# Patient Record
Sex: Male | Born: 1975
Health system: Southern US, Community
[De-identification: ages and names within clinical notes are randomized; demographics above are authoritative.]

## PROBLEM LIST (undated history)

## (undated) DIAGNOSIS — K5792 Diverticulitis of intestine, part unspecified, without perforation or abscess without bleeding: Secondary | ICD-10-CM

## (undated) DIAGNOSIS — M719 Bursopathy, unspecified: Secondary | ICD-10-CM

## (undated) DIAGNOSIS — J939 Pneumothorax, unspecified: Secondary | ICD-10-CM

## (undated) DIAGNOSIS — M5430 Sciatica, unspecified side: Secondary | ICD-10-CM

## (undated) HISTORY — DX: Diverticulitis of intestine, part unspecified, without perforation or abscess without bleeding: K57.92

## (undated) HISTORY — PX: CHEST TUBE INSERTION: SHX231

## (undated) HISTORY — PX: HERNIA REPAIR: SHX51

## (undated) HISTORY — DX: Sciatica, unspecified side: M54.30

---

## 2004-02-02 ENCOUNTER — Emergency Department: Payer: Self-pay | Admitting: Internal Medicine

## 2005-02-27 ENCOUNTER — Emergency Department: Payer: Self-pay | Admitting: Internal Medicine

## 2005-04-22 ENCOUNTER — Emergency Department: Payer: Self-pay | Admitting: Emergency Medicine

## 2006-06-04 ENCOUNTER — Emergency Department: Payer: Self-pay | Admitting: Emergency Medicine

## 2007-03-24 ENCOUNTER — Emergency Department: Payer: Self-pay | Admitting: Emergency Medicine

## 2007-07-13 ENCOUNTER — Emergency Department: Payer: Self-pay | Admitting: Emergency Medicine

## 2008-02-09 ENCOUNTER — Emergency Department: Payer: Self-pay | Admitting: Emergency Medicine

## 2008-07-04 ENCOUNTER — Emergency Department: Payer: Self-pay | Admitting: Emergency Medicine

## 2009-03-16 ENCOUNTER — Emergency Department: Payer: Self-pay | Admitting: Emergency Medicine

## 2009-11-28 ENCOUNTER — Emergency Department: Payer: Self-pay | Admitting: Emergency Medicine

## 2011-04-25 ENCOUNTER — Emergency Department: Payer: Self-pay | Admitting: Emergency Medicine

## 2011-07-16 ENCOUNTER — Emergency Department: Payer: Self-pay | Admitting: Internal Medicine

## 2011-07-16 LAB — CK TOTAL AND CKMB (NOT AT ARMC)
CK, Total: 209 U/L (ref 35–232)
CK-MB: 2.1 ng/mL (ref 0.5–3.6)

## 2011-07-16 LAB — CBC
HCT: 40.7 % (ref 40.0–52.0)
HGB: 13.2 g/dL (ref 13.0–18.0)
MCHC: 32.5 g/dL (ref 32.0–36.0)
MCV: 96 fL (ref 80–100)
RBC: 4.24 10*6/uL — ABNORMAL LOW (ref 4.40–5.90)
RDW: 12.9 % (ref 11.5–14.5)

## 2011-07-16 LAB — BASIC METABOLIC PANEL
Anion Gap: 9 (ref 7–16)
BUN: 12 mg/dL (ref 7–18)
Chloride: 107 mmol/L (ref 98–107)
Co2: 24 mmol/L (ref 21–32)
Glucose: 87 mg/dL (ref 65–99)
Osmolality: 279 (ref 275–301)

## 2011-07-16 LAB — TROPONIN I: Troponin-I: <0.02 ng/mL

## 2012-02-07 ENCOUNTER — Emergency Department: Payer: Self-pay | Admitting: Emergency Medicine

## 2012-04-13 ENCOUNTER — Emergency Department: Payer: Self-pay | Admitting: Emergency Medicine

## 2012-04-13 LAB — COMPREHENSIVE METABOLIC PANEL
Alkaline Phosphatase: 71 U/L (ref 50–136)
BUN: 11 mg/dL (ref 7–18)
Bilirubin,Total: 0.8 mg/dL (ref 0.2–1.0)
Calcium, Total: 8.5 mg/dL (ref 8.5–10.1)
Chloride: 108 mmol/L — ABNORMAL HIGH (ref 98–107)
Co2: 26 mmol/L (ref 21–32)
Creatinine: 0.69 mg/dL (ref 0.60–1.30)
EGFR (African American): >60
EGFR (Non-African Amer.): >60
Glucose: 90 mg/dL (ref 65–99)
SGPT (ALT): 23 U/L (ref 12–78)
Sodium: 139 mmol/L (ref 136–145)
Total Protein: 6.8 g/dL (ref 6.4–8.2)

## 2012-04-13 LAB — URINALYSIS, COMPLETE
Bacteria: NONE SEEN
Bilirubin,UR: NEGATIVE
Blood: NEGATIVE
RBC,UR: 1 /HPF (ref 0–5)
Specific Gravity: 1.018 (ref 1.003–1.030)
Squamous Epithelial: NONE SEEN
WBC UR: 1 /HPF (ref 0–5)

## 2012-04-13 LAB — CBC
HGB: 13.2 g/dL (ref 13.0–18.0)
Platelet: 259 10*3/uL (ref 150–440)
RBC: 4.11 10*6/uL — ABNORMAL LOW (ref 4.40–5.90)
WBC: 5.3 10*3/uL (ref 3.8–10.6)

## 2012-04-13 LAB — LIPASE, BLOOD: Lipase: 61 U/L — ABNORMAL LOW (ref 73–393)

## 2012-10-01 ENCOUNTER — Emergency Department: Payer: Self-pay | Admitting: Emergency Medicine

## 2012-12-29 ENCOUNTER — Emergency Department: Payer: Self-pay | Admitting: Emergency Medicine

## 2013-02-15 ENCOUNTER — Emergency Department: Payer: Self-pay | Admitting: Emergency Medicine

## 2013-02-15 LAB — RAPID INFLUENZA A&B ANTIGENS (ARMC ONLY)

## 2013-02-17 ENCOUNTER — Emergency Department: Payer: Self-pay | Admitting: Emergency Medicine

## 2013-05-27 ENCOUNTER — Emergency Department: Payer: Self-pay | Admitting: Emergency Medicine

## 2013-10-13 DIAGNOSIS — M7712 Lateral epicondylitis, left elbow: Secondary | ICD-10-CM | POA: Insufficient documentation

## 2013-11-23 ENCOUNTER — Emergency Department: Payer: Self-pay | Admitting: Student

## 2014-03-21 ENCOUNTER — Emergency Department: Payer: Self-pay | Admitting: Emergency Medicine

## 2014-05-18 ENCOUNTER — Emergency Department
Admission: EM | Admit: 2014-05-18 | Discharge: 2014-05-18 | Disposition: A | Discharge status: Home / Self Care | Payer: No Typology Code available for payment source | Attending: Emergency Medicine | Admitting: Emergency Medicine

## 2014-05-18 ENCOUNTER — Encounter: Payer: Self-pay | Admitting: *Deleted

## 2014-05-18 DIAGNOSIS — M549 Dorsalgia, unspecified: Secondary | ICD-10-CM | POA: Diagnosis not present

## 2014-05-18 DIAGNOSIS — M5430 Sciatica, unspecified side: Secondary | ICD-10-CM

## 2014-05-18 DIAGNOSIS — Z720 Tobacco use: Secondary | ICD-10-CM | POA: Diagnosis not present

## 2014-05-18 DIAGNOSIS — M5432 Sciatica, left side: Secondary | ICD-10-CM | POA: Diagnosis not present

## 2014-05-18 DIAGNOSIS — G8929 Other chronic pain: Secondary | ICD-10-CM | POA: Insufficient documentation

## 2014-05-18 LAB — URINALYSIS COMPLETE WITH MICROSCOPIC (ARMC ONLY)
BACTERIA UA: NONE SEEN
BILIRUBIN URINE: NEGATIVE
GLUCOSE, UA: NEGATIVE mg/dL
Hgb urine dipstick: NEGATIVE
Ketones, ur: NEGATIVE mg/dL
LEUKOCYTES UA: NEGATIVE
Nitrite: NEGATIVE
Protein, ur: NEGATIVE mg/dL
SPECIFIC GRAVITY, URINE: 1.02 (ref 1.005–1.030)
Squamous Epithelial / LPF: NONE SEEN
pH: 7 (ref 5.0–8.0)

## 2014-05-18 MED ORDER — KETOROLAC TROMETHAMINE 60 MG/2ML IM SOLN
60.0000 mg | Freq: Once | INTRAMUSCULAR | Status: AC
  Administered 2014-05-18: 60 mg via INTRAMUSCULAR

## 2014-05-18 MED ORDER — KETOROLAC TROMETHAMINE 60 MG/2ML IM SOLN
INTRAMUSCULAR | Status: AC
  Administered 2014-05-18: 60 mg
  Filled 2014-05-18: qty 2

## 2014-05-18 MED ORDER — CYCLOBENZAPRINE HCL 5 MG PO TABS: 5.0000 mg | ORAL_TABLET | Freq: Three times a day (TID) | ORAL | Status: DC | PRN

## 2014-05-18 MED ORDER — KETOROLAC TROMETHAMINE 60 MG/2ML IM SOLN
INTRAMUSCULAR | Status: AC
  Filled 2014-05-18: qty 2

## 2014-05-18 MED ORDER — KETOROLAC TROMETHAMINE 10 MG PO TABS: 10.0000 mg | ORAL_TABLET | Freq: Three times a day (TID) | ORAL | Status: DC

## 2014-05-18 NOTE — Discharge Instructions (Signed)
Chronic Back Pain  When back pain lasts longer than 3 months, it is called chronic back pain.People with chronic back pain often go through certain periods that are more intense (flare-ups).  CAUSES Chronic back pain can be caused by wear and tear (degeneration) on different structures in your back. These structures include:  The bones of your spine (vertebrae) and the joints surrounding your spinal cord and nerve roots (facets).  The strong, fibrous tissues that connect your vertebrae (ligaments). Degeneration of these structures may result in pressure on your nerves. This can lead to constant pain. HOME CARE INSTRUCTIONS  Avoid bending, heavy lifting, prolonged sitting, and activities which make the problem worse.  Take brief periods of rest throughout the day to reduce your pain. Lying down or standing usually is better than sitting while you are resting.  Take over-the-counter or prescription medicines only as directed by your caregiver. SEEK IMMEDIATE MEDICAL CARE IF:   You have weakness or numbness in one of your legs or feet.  You have trouble controlling your bladder or bowels.  You have nausea, vomiting, abdominal pain, shortness of breath, or fainting. Document Released: 02/01/2004 Document Revised: 03/18/2011 Document Reviewed: 12/08/2010 [facility] Patient Information 2015 [place], [place]. This information is not intended to replace advice given to you by your health care provider. Make sure you discuss any questions you have with your health care provider.  Sciatica Sciatica is pain, weakness, numbness, or tingling along your sciatic nerve. The nerve starts in the lower back and runs down the back of each leg. Nerve damage or certain conditions pinch or put pressure on the sciatic nerve. This causes the pain, weakness, and other discomforts of sciatica. HOME CARE   Only take medicine as told by your doctor.  Apply ice to the affected area for 20 minutes. Do this 3-4 times  a day for the first 48-72 hours. Then try heat in the same way.  Exercise, stretch, or do your usual activities if these do not make your pain worse.  Go to physical therapy as told by your doctor.  Keep all doctor visits as told.  Do not wear high heels or shoes that are not supportive.  Get a firm mattress if your mattress is too soft to lessen pain and discomfort. GET HELP RIGHT AWAY IF:   You cannot control when you poop (bowel movement) or pee (urinate).  You have more weakness in your lower back, lower belly (pelvis), butt (buttocks), or legs.  You have redness or puffiness (swelling) of your back.  You have a burning feeling when you pee.  You have pain that gets worse when you lie down.  You have pain that wakes you from your sleep.  Your pain is worse than past pain.  Your pain lasts longer than 4 weeks.  You are suddenly losing weight without reason. MAKE SURE YOU:   Understand these instructions.  Will watch this condition.  Will get help right away if you are not doing well or get worse. Document Released: 10/03/2007 Document Revised: 06/25/2011 Document Reviewed: 05/05/2011 [facility] Patient Information 2015 [place], [place]. This information is not intended to replace advice given to you by your health care provider. Make sure you discuss any questions you have with your health care provider.  Take the prescription meds as directed.  Follow-up with your provider for continued problems.

## 2014-05-18 NOTE — ED Notes (Signed)
Pt has chronic back pain, pain flared up today

## 2014-05-18 NOTE — ED Provider Notes (Signed)
[facility] ?____________________________________________ ? Time seen: 0736 ? I have reviewed the triage vital signs and the nursing notes. ________ HISTORY ? Chief Complaint Back Pain  HPI  Frederick Mendoza is a 39 y.o. male with a history of left-sided sciatica, reports to the ED with a flare of his pain since yesterday. He says it feels like his typical sciatic irritation. He denies any recent injury, trauma, or accident to pinpoint the onset. 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 He typically takes ibuprofen has done so without significant relief. He denies any other prescription medications at this time. He is also without any incontinence, left low lower extremity weakness, or difficulty walking. He is here for pain relief.  No past medical history on file.  There are no active problems to display for this patient. ? No past surgical history on file. ? Current Outpatient Rx  Name  Route  Sig  Dispense  Refill  . cyclobenzaprine (FLEXERIL) 5 MG tablet   Oral   Take 1 tablet (5 mg total) by mouth every 8 (eight) hours as needed for muscle spasms.   12 tablet   0   . ketorolac (TORADOL) 10 MG tablet   Oral   Take 1 tablet (10 mg total) by mouth every 8 (eight) hours.   15 tablet   0   ? Allergies Review of patient's allergies indicates no known allergies. ? No family history on file. ? Social History History  Substance Use Topics  . Smoking status: Current Every Day Smoker -- 0.50 packs/day  . Smokeless tobacco: Not on file  . Alcohol Use: No   Review of Systems  Constitutional: Negative for fever. HEENT: Negative for head trauma, visual changes, sore throat. Cardiovascular: Negative for chest pain. Respiratory: Negative for shortness of breath. Musculoskeletal: Positive for familiar for back pain. Skin: Negative for rash. Neurological: Negative for  headaches, focal weakness or numbness.  10-point ROS otherwise negative. ____________________________________________  PHYSICAL EXAM:  VITAL SIGNS: ED Triage Vitals  Enc Vitals Group     BP 05/18/14 0714 135/70 mmHg     Pulse Rate 05/18/14 0714 74     Resp --      Temp 05/18/14 0714 98 F (36.7 C)     Temp Source 05/18/14 0714 Oral     SpO2 05/18/14 0714 98 %     Weight 05/18/14 0714 170 lb (77.111 kg)     Height 05/18/14 0714 6' (1.829 m)     Head Cir --      Peak Flow --      Pain Score 05/18/14 0715 7     Pain Loc --      Pain Edu? --      Excl. in GC? --    Constitutional: Alert and oriented. Well appearing and in no distress. HEENT:Normocephalic and atraumatic.  PERRL. Normal extraocular movements.  No congestion/rhinnorhea. Mucous membranes are moist. Neck: Supple. No cervical lymphadenopathy. Cardiovascular: Normal rate, regular rhythm. No murmurs, rubs, or gallops. Normal and symmetric distal pulses are present in all extremities.  Respiratory: Normal respiratory effort without tachypnea. Breath sounds are clear and equal bilaterally. No wheezes/rales/rhonchi. Gastrointestinal: Soft and nontender. No distention. No abdominal bruits. There is no CVA tenderness. Musculoskeletal: Nontender with normal range of motion in all extremities. No joint effusions.  No lower extremity tenderness nor edema. Decreased lumbar extension normal lumbar flexion on exam. Patient with  negative straight leg raise bilaterally. She with minimal tenderness to palpation in the midline of the lumbar sacral junction. Neurologic:  Normal speech and language. CN II-XII grossly intact. No gait instability. Skin:  Skin is warm, dry and intact. No rash noted. Psychiatric: Mood and affect are normal. Patient exhibits appropriate insight and judgment. _____________ PROCEDURES ? Procedure(s) performed: Toradol 60 mg IM Critical Care performed:  None ______________________________________________________ INITIAL IMPRESSION / ASSESSMENT AND PLAN / ED COURSE  Treatment for typical sciatic flare with anti-inflammatory and muscle relaxant. Follow-up with PMD as needed.   Pertinent labs & imaging results that were available during my care of the patient were reviewed by me and considered in my medical decision making (see chart for details).  ____________________________________________ FINAL CLINICAL IMPRESSION(S) / ED DIAGNOSES?  Final diagnoses:  Sciatic leg pain  Exacerbation of chronic back pain      Lissa HoardJenise V Bacon Mikea Quadros, PA-C 05/18/14 [id number]  Darien Ramusavid W Kaminski, MD 05/18/14 [phone]

## 2014-05-18 NOTE — ED Notes (Signed)
Has chronic back restarted last pm

## 2014-08-15 ENCOUNTER — Emergency Department
Admission: EM | Admit: 2014-08-15 | Discharge: 2014-08-15 | Disposition: A | Discharge status: Home / Self Care | Payer: No Typology Code available for payment source | Attending: Emergency Medicine | Admitting: Emergency Medicine

## 2014-08-15 ENCOUNTER — Other Ambulatory Visit: Payer: Self-pay

## 2014-08-15 ENCOUNTER — Emergency Department: Payer: No Typology Code available for payment source

## 2014-08-15 ENCOUNTER — Encounter: Payer: Self-pay | Admitting: Emergency Medicine

## 2014-08-15 DIAGNOSIS — Z79899 Other long term (current) drug therapy: Secondary | ICD-10-CM | POA: Insufficient documentation

## 2014-08-15 DIAGNOSIS — Z720 Tobacco use: Secondary | ICD-10-CM | POA: Diagnosis not present

## 2014-08-15 DIAGNOSIS — R0602 Shortness of breath: Secondary | ICD-10-CM | POA: Diagnosis present

## 2014-08-15 DIAGNOSIS — Z880 Allergy status to penicillin: Secondary | ICD-10-CM | POA: Diagnosis not present

## 2014-08-15 DIAGNOSIS — J441 Chronic obstructive pulmonary disease with (acute) exacerbation: Secondary | ICD-10-CM | POA: Diagnosis not present

## 2014-08-15 DIAGNOSIS — R079 Chest pain, unspecified: Secondary | ICD-10-CM | POA: Diagnosis not present

## 2014-08-15 HISTORY — DX: Pneumothorax, unspecified: J93.9

## 2014-08-15 LAB — CBC
HEMATOCRIT: 39.7 % — AB (ref 40.0–52.0)
HEMOGLOBIN: 13.6 g/dL (ref 13.0–18.0)
MCH: 31.2 pg (ref 26.0–34.0)
MCHC: 34.2 g/dL (ref 32.0–36.0)
MCV: 91.3 fL (ref 80.0–100.0)
Platelets: 276 10*3/uL (ref 150–440)
RBC: 4.35 MIL/uL — ABNORMAL LOW (ref 4.40–5.90)
RDW: 13.3 % (ref 11.5–14.5)
WBC: 8.3 10*3/uL (ref 3.8–10.6)

## 2014-08-15 LAB — TROPONIN I
Troponin I: <0.03 ng/mL (ref <0.031)
Troponin I: <0.03 ng/mL (ref <0.031)

## 2014-08-15 LAB — BASIC METABOLIC PANEL
ANION GAP: 9 (ref 5–15)
BUN: 11 mg/dL (ref 6–20)
CO2: 28 mmol/L (ref 22–32)
Calcium: 9.2 mg/dL (ref 8.9–10.3)
Chloride: 105 mmol/L (ref 101–111)
Creatinine, Ser: 0.79 mg/dL (ref 0.61–1.24)
Glucose, Bld: 96 mg/dL (ref 65–99)
Potassium: 4 mmol/L (ref 3.5–5.1)
Sodium: 142 mmol/L (ref 135–145)

## 2014-08-15 LAB — BRAIN NATRIURETIC PEPTIDE: B NATRIURETIC PEPTIDE 5: 31 pg/mL (ref 0.0–100.0)

## 2014-08-15 MED ORDER — ASPIRIN 81 MG PO CHEW
324.0000 mg | CHEWABLE_TABLET | Freq: Once | ORAL | Status: AC
  Administered 2014-08-15: 324 mg via ORAL
  Filled 2014-08-15: qty 4

## 2014-08-15 MED ORDER — PREDNISONE 20 MG PO TABS: 40.0000 mg | ORAL_TABLET | Freq: Every day | ORAL | Status: DC

## 2014-08-15 MED ORDER — ALBUTEROL SULFATE HFA 108 (90 BASE) MCG/ACT IN AERS: 2.0000 | INHALATION_SPRAY | Freq: Four times a day (QID) | RESPIRATORY_TRACT | Status: DC | PRN

## 2014-08-15 MED ORDER — IPRATROPIUM-ALBUTEROL 0.5-2.5 (3) MG/3ML IN SOLN
3.0000 mL | Freq: Once | RESPIRATORY_TRACT | Status: AC
  Administered 2014-08-15: 3 mL via RESPIRATORY_TRACT
  Filled 2014-08-15: qty 3

## 2014-08-15 MED ORDER — PREDNISONE 20 MG PO TABS
40.0000 mg | ORAL_TABLET | Freq: Once | ORAL | Status: AC
  Administered 2014-08-15: 40 mg via ORAL
  Filled 2014-08-15: qty 2

## 2014-08-15 MED ORDER — IBUPROFEN 600 MG PO TABS
600.0000 mg | ORAL_TABLET | Freq: Once | ORAL | Status: AC
  Administered 2014-08-15: 600 mg via ORAL
  Filled 2014-08-15: qty 1

## 2014-08-15 MED ORDER — IOHEXOL 350 MG/ML SOLN
100.0000 mL | Freq: Once | INTRAVENOUS | Status: AC | PRN
  Administered 2014-08-15: 100 mL via INTRAVENOUS

## 2014-08-15 NOTE — ED Provider Notes (Signed)
[facility] Emergency Department Provider Note  ____________________________________________  Time seen: Approximately 8:21 AM  I have reviewed the triage vital signs and the nursing notes.   HISTORY  Chief Complaint Shortness of Breath and Chest Pain    HPI Frederick Mendoza is a 39 y.o. male  denies medical history except from a spontaneous pneumothorax several years ago.Presents today for evaluation of 92 months of age chronic stabbing pain in the left upper chest but does not seem to change with exertion. He reports a moderate feeling of a stabbing in the far left upper chest. He also reports that he has had a chronic nonproductive cough for about 4 months, he will occasionally wheeze and uses his son's inhaler.  No chest tightness or pressure. Does not radiate. No numbness or tingling. No chest pain worsening with exertion. Does not change with deep inspiration. She presently does not feel that he has any trouble breathing, but does have this chronic ongoing pain in the left chest.  Patient reports smoking history since age 22. He is currently trying to stop.  No recent trips or travel. No history of previous blood clot. No bloody cough. No swelling in his legs. No recent surgeries.   Past Medical History  Diagnosis Date  . Pneumothorax     There are no active problems to display for this patient.   History reviewed. No pertinent past surgical history.  Current Outpatient Rx  Name  Route  Sig  Dispense  Refill  . ibuprofen (ADVIL,MOTRIN) 200 MG tablet   Oral   Take 200 mg by mouth every 6 (six) hours as needed for fever, headache, mild pain or moderate pain.         . cyclobenzaprine (FLEXERIL) 5 MG tablet   Oral   Take 1 tablet (5 mg total) by mouth every 8 (eight) hours as needed for muscle spasms. Patient not taking: Reported on 08/15/2014   12 tablet   0   . ketorolac (TORADOL) 10 MG tablet   Oral   Take 1 tablet (10 mg total) by mouth  every 8 (eight) hours. Patient not taking: Reported on 08/15/2014   15 tablet   0     Allergies Penicillins  No family history on file.  Social History History  Substance Use Topics  . Smoking status: Current Every Day Smoker -- 0.50 packs/day  . Smokeless tobacco: Not on file  . Alcohol Use: No    Review of Systems Constitutional: No fever/chills Eyes: No visual changes. ENT: No sore throat. Cardiovascular: See history of present illness  Respiratory: Denies shortness of breath at present, but will occasionally have this with fits of coughing with reported wheezing at home at times. Gastrointestinal: No abdominal pain.  No nausea, no vomiting.  No diarrhea.  No constipation. Genitourinary: Negative for dysuria. Musculoskeletal: Negative for back pain. Skin: Negative for rash. Neurological: Negative for headaches, focal weakness or numbness.  10-point ROS otherwise negative.  ____________________________________________   PHYSICAL EXAM:  VITAL SIGNS: ED Triage Vitals  Enc Vitals Group     BP 08/15/14 0809 115/73 mmHg     Pulse Rate 08/15/14 0809 68     Resp 08/15/14 0809 20     Temp --      Temp src --      SpO2 08/15/14 0809 97 %     Weight 08/15/14 0741 180 lb (81.647 kg)     Height 08/15/14 0741 5\' 11"  (1.803 m)  Head Cir --      Peak Flow --      Pain Score 08/15/14 0742 5     Pain Loc --      Pain Edu? --      Excl. in GC? --     Constitutional: Alert and oriented, somewhat anxious. Well appearing and in no acute distress. Eyes: Conjunctivae are normal. PERRL. EOMI. Head: Atraumatic. Nose: No congestion/rhinnorhea. Mouth/Throat: Mucous membranes are moist.  Oropharynx non-erythematous. Neck: No stridor.  No cervical lymphadenopathy Cardiovascular: Normal rate, regular rhythm. Grossly normal heart sounds.  Good peripheral circulation. Respiratory: Normal respiratory effort.  No retractions. Lungs CTAB except for some occasional can't an x-ray  wheezes in the left lower lobe. Gastrointestinal: Soft and nontender. No distention. No abdominal bruits. No CVA tenderness. Musculoskeletal: No lower extremity tenderness nor edema.  No joint effusions. No lower extremity edema. Neurologic:  Normal speech and language. No gross focal neurologic deficits are appreciated.  Skin:  Skin is warm, dry and intact. No rash noted. Psychiatric: Mood and affect are normal. Speech and behavior are normal.  ____________________________________________   LABS (all labs ordered are listed, but only abnormal results are displayed)  Labs Reviewed  CBC - Abnormal; Notable for the following:    RBC 4.35 (*)    HCT 39.7 (*)    All other components within normal limits  BASIC METABOLIC PANEL  TROPONIN I  BRAIN NATRIURETIC PEPTIDE  TROPONIN I   ____________________________________________  EKG  Interpreted and reviewed by me Time 745 Normal sinus rhythm Ventricular rate 80 ST elevation is noted, and appears to be a J-point type of elevation significant for early repolarization seen in lateral and septal leads Compared with previous EKG from 07/16/2011 there is no significant change QRS 92 QTc 422 ____________________________________________  RADIOLOGY   IMPRESSION: 1. No acute infiltrate or pulmonary edema. 2. Degenerative changes lower thoracic spine. 3. No pulmonary embolus. No mediastinal hematoma or adenopathy. 4. Single small emphysematous bulla in right middle lobe. ____________________________________________   PROCEDURES  Procedure(s) performed: None  Critical Care performed: No  ____________________________________________   INITIAL IMPRESSION / ASSESSMENT AND PLAN / ED COURSE  Pertinent labs & imaging results that were available during my care of the patient were reviewed by me and considered in my medical decision making (see chart for details).  Left-sided sharp chest pain without associated cough and wheezing for  about 4 months. This is very atypical of cardiac, and the patient is low risk. We will send a single troponin as we ruled this out, primary consideration is given for possible chronic bronchitis, mass, felt much less likely to be pulmonary embolism, no evidence pneumothorax.  Plan to obtain a single troponin, basic labs, and we will also order a CT scan to further evaluate possible soft tissue mass not well demarcated on chest x-ray.  ----------------------------------------- 11:57 AM on 08/15/2014 -----------------------------------------  Awake alert and oriented. No distress. Troponin negative twice. Patient is low risk by heart score for cardiac disease. Refer him to his primary care doctor, as well as follow-up with cardiology. Respiratory and chest pain or percussion to advise. I suspect the patient is likely suffering from a slight COPD attack/bronchitis, and discussed return precautions and treatment recommendations the patient is agreeable.  ____________________________________________   FINAL CLINICAL IMPRESSION(S) / ED DIAGNOSES  Final diagnoses:  Left sided chest pain  COPD exacerbation      Sharyn Creamer, MD 08/15/14 1206

## 2014-08-15 NOTE — ED Notes (Signed)
Discharge and follow up instructions reviewed

## 2014-08-15 NOTE — ED Notes (Signed)
Pt presents with cough, shortness of breath and chest pain for four mths. Pt with hx of smoking.

## 2014-08-15 NOTE — Discharge Instructions (Signed)
We believe that your symptoms are caused today by an exacerbation of your COPD, and possibly bronchitis.  Please take the prescribed medications and any medications that you have at home for your COPD.  Follow up with your doctor as recommended.  If you develop any new or worsening symptoms, including but not limited to fever, persistent vomiting, worsening shortness of breath, or other symptoms that concern you, please return to the Emergency Department immediately.   Chest Pain Observation It is often hard to give a specific diagnosis for the cause of chest pain. Among other possibilities your symptoms might be caused by inadequate oxygen delivery to your heart (angina). Angina that is not treated or evaluated can lead to a heart attack (myocardial infarction) or death. Blood tests, electrocardiograms, and X-rays may have been done to help determine a possible cause of your chest pain. After evaluation and observation, your health care provider has determined that it is unlikely your pain was caused by an unstable condition that requires hospitalization. However, a full evaluation of your pain may need to be completed, with additional diagnostic testing as directed. It is very important to keep your follow-up appointments. Not keeping your follow-up appointments could result in permanent heart damage, disability, or death. If there is any problem keeping your follow-up appointments, you must call your health care provider. HOME CARE INSTRUCTIONS  Due to the slight chance that your pain could be angina, it is important to follow your health care provider's treatment plan and also maintain a healthy lifestyle:  Maintain or work toward achieving a healthy weight.  Stay physically active and exercise regularly.  Decrease your salt intake.  Eat a balanced, healthy diet. Talk to a dietitian to learn about heart-healthy foods.  Increase your fiber intake by including whole grains, vegetables, fruits, and  nuts in your diet.  Avoid situations that cause stress, anger, or depression.  Take medicines as advised by your health care provider. Report any side effects to your health care provider. Do not stop medicines or adjust the dosages on your own.  Quit smoking. Do not use nicotine patches or gum until you check with your health care provider.  Keep your blood pressure, blood sugar, and cholesterol levels within normal limits.  Limit alcohol intake to no more than 1 drink per day for women who are not pregnant and 2 drinks per day for men.  Do not abuse drugs. SEEK IMMEDIATE MEDICAL CARE IF: You have severe chest pain or pressure which may include symptoms such as:  You feel pain or pressure in your arms, neck, jaw, or back.  You have severe back or abdominal pain, feel sick to your stomach (nauseous), or throw up (vomit).  You are sweating profusely.  You are having a fast or irregular heartbeat.  You feel short of breath while at rest.  You notice increasing shortness of breath during rest, sleep, or with activity.  You have chest pain that does not get better after rest or after taking your usual medicine.  You wake from sleep with chest pain.  You are unable to sleep because you cannot breathe.  You develop a frequent cough or you are coughing up blood.  You feel dizzy, faint, or experience extreme fatigue.  You develop severe weakness, dizziness, fainting, or chills. Any of these symptoms may represent a serious problem that is an emergency. Do not wait to see if the symptoms will go away. Call your local emergency services (911 in the [place] Do  not drive yourself to the hospital. MAKE SURE YOU:  Understand these instructions.  Will watch your condition.  Will get help right away if you are not doing well or get worse. Document Released: 01/26/2010 Document Revised: 12/29/2012 Document Reviewed: 06/25/2012 [employer] Patient Information 2015 [place], [place]. This  information is not intended to replace advice given to you by your health care provider. Make sure you discuss any questions you have with your health care provider.  Chronic Obstructive Pulmonary Disease Chronic obstructive pulmonary disease (COPD) is a common lung condition in which airflow from the lungs is limited. COPD is a general term that can be used to describe many different lung problems that limit airflow, including both chronic bronchitis and emphysema. If you have COPD, your lung function will probably never return to normal, but there are measures you can take to improve lung function and make yourself feel better.  CAUSES   Smoking (common).   Exposure to secondhand smoke.   Genetic problems.  Chronic inflammatory lung diseases or recurrent infections. SYMPTOMS   Shortness of breath, especially with physical activity.   Deep, persistent (chronic) cough with a large amount of thick mucus.   Wheezing.   Rapid breaths (tachypnea).   Gray or bluish discoloration (cyanosis) of the skin, especially in fingers, toes, or lips.   Fatigue.   Weight loss.   Frequent infections or episodes when breathing symptoms become much worse (exacerbations).   Chest tightness. DIAGNOSIS  Your health care provider will take a medical history and perform a physical examination to make the initial diagnosis. Additional tests for COPD may include:   Lung (pulmonary) function tests.  Chest X-ray.  CT scan.  Blood tests. TREATMENT  Treatment available to help you feel better when you have COPD includes:   Inhaler and nebulizer medicines. These help manage the symptoms of COPD and make your breathing more comfortable.  Supplemental oxygen. Supplemental oxygen is only helpful if you have a low oxygen level in your blood.   Exercise and physical activity. These are beneficial for nearly all people with COPD. Some people may also benefit from a pulmonary rehabilitation  program. HOME CARE INSTRUCTIONS   Take all medicines (inhaled or pills) as directed by your health care provider.  Avoid over-the-counter medicines or cough syrups that dry up your airway (such as antihistamines) and slow down the elimination of secretions unless instructed otherwise by your health care provider.   If you are a smoker, the most important thing that you can do is stop smoking. Continuing to smoke will cause further lung damage and breathing trouble. Ask your health care provider for help with quitting smoking. He or she can direct you to community resources or hospitals that provide support.  Avoid exposure to irritants such as smoke, chemicals, and fumes that aggravate your breathing.  Use oxygen therapy and pulmonary rehabilitation if directed by your health care provider. If you require home oxygen therapy, ask your health care provider whether you should purchase a pulse oximeter to measure your oxygen level at home.   Avoid contact with individuals who have a contagious illness.  Avoid extreme temperature and humidity changes.  Eat healthy foods. Eating smaller, more frequent meals and resting before meals may help you maintain your strength.  Stay active, but balance activity with periods of rest. Exercise and physical activity will help you maintain your ability to do things you want to do.  Preventing infection and hospitalization is very important when you have COPD. Make  sure to receive all the vaccines your health care provider recommends, especially the pneumococcal and influenza vaccines. Ask your health care provider whether you need a pneumonia vaccine.  Learn and use relaxation techniques to manage stress.  Learn and use controlled breathing techniques as directed by your health care provider. Controlled breathing techniques include:   Pursed lip breathing. Start by breathing in (inhaling) through your nose for 1 second. Then, purse your lips as if you  were going to whistle and breathe out (exhale) through the pursed lips for 2 seconds.   Diaphragmatic breathing. Start by putting one hand on your abdomen just above your waist. Inhale slowly through your nose. The hand on your abdomen should move out. Then purse your lips and exhale slowly. You should be able to feel the hand on your abdomen moving in as you exhale.   Learn and use controlled coughing to clear mucus from your lungs. Controlled coughing is a series of short, progressive coughs. The steps of controlled coughing are:  1. Lean your head slightly forward.  2. Breathe in deeply using diaphragmatic breathing.  3. Try to hold your breath for 3 seconds.  4. Keep your mouth slightly open while coughing twice.  5. Spit any mucus out into a tissue.  6. Rest and repeat the steps once or twice as needed. SEEK MEDICAL CARE IF:   You are coughing up more mucus than usual.   There is a change in the color or thickness of your mucus.   Your breathing is more labored than usual.   Your breathing is faster than usual.  SEEK IMMEDIATE MEDICAL CARE IF:   You have shortness of breath while you are resting.   You have shortness of breath that prevents you from:  Being able to talk.   Performing your usual physical activities.   You have chest pain lasting longer than 5 minutes.   Your skin color is more cyanotic than usual.  You measure low oxygen saturations for longer than 5 minutes with a pulse oximeter. MAKE SURE YOU:   Understand these instructions.  Will watch your condition.  Will get help right away if you are not doing well or get worse. Document Released: 10/03/2004 Document Revised: 05/10/2013 Document Reviewed: 08/20/2012 [facility] Patient Information 2015 [place], [place]. This information is not intended to replace advice given to you by your health care provider. Make sure you discuss any questions you have with your health care provider.

## 2014-10-27 ENCOUNTER — Encounter: Payer: Self-pay | Admitting: Emergency Medicine

## 2014-10-27 ENCOUNTER — Emergency Department
Admission: EM | Admit: 2014-10-27 | Discharge: 2014-10-27 | Disposition: A | Discharge status: Home / Self Care | Payer: No Typology Code available for payment source | Attending: Emergency Medicine | Admitting: Emergency Medicine

## 2014-10-27 DIAGNOSIS — R05 Cough: Secondary | ICD-10-CM | POA: Diagnosis present

## 2014-10-27 DIAGNOSIS — Z880 Allergy status to penicillin: Secondary | ICD-10-CM | POA: Insufficient documentation

## 2014-10-27 DIAGNOSIS — Z791 Long term (current) use of non-steroidal anti-inflammatories (NSAID): Secondary | ICD-10-CM | POA: Diagnosis not present

## 2014-10-27 DIAGNOSIS — B349 Viral infection, unspecified: Secondary | ICD-10-CM | POA: Insufficient documentation

## 2014-10-27 DIAGNOSIS — Z79899 Other long term (current) drug therapy: Secondary | ICD-10-CM | POA: Insufficient documentation

## 2014-10-27 DIAGNOSIS — Z720 Tobacco use: Secondary | ICD-10-CM | POA: Diagnosis not present

## 2014-10-27 DIAGNOSIS — J069 Acute upper respiratory infection, unspecified: Secondary | ICD-10-CM | POA: Insufficient documentation

## 2014-10-27 NOTE — ED Notes (Signed)
Says feels achey , coug since yesterday.

## 2014-10-27 NOTE — ED Notes (Signed)
Developed cough with some body aches 2 days ago  Also has scratchy throat lungs clear   Cough is non prod

## 2014-10-27 NOTE — ED Provider Notes (Signed)
[facility] Emergency Department Provider Note  ____________________________________________  Time seen: Approximately 7:50 AM  I have reviewed the triage vital signs and the nursing notes.   HISTORY  Chief Complaint Influenza; Cough; and Generalized Body Aches  HPI Frederick Mendoza is a 39 y.o. male is here with complaint of nonproductive cough for 2 days along with body aches and scratchy throat. Patient did taking over-the-counter Tylenol cold with some minimal relief. Patient has his son here with same symptoms.She also continues to smoke one half pack cigarettes per day. Currently he rates his pain as 5 out of 10. He also states along with his URI symptoms that he had infrequent diarrhea yesterday which is clear today.   Past Medical History  Diagnosis Date  . Pneumothorax     There are no active problems to display for this patient.   No past surgical history on file.  Current Outpatient Rx  Name  Route  Sig  Dispense  Refill  . albuterol (PROVENTIL HFA;VENTOLIN HFA) 108 (90 BASE) MCG/ACT inhaler   Inhalation   Inhale 2 puffs into the lungs every 6 (six) hours as needed for wheezing or shortness of breath.   1 Inhaler   2   . cyclobenzaprine (FLEXERIL) 5 MG tablet   Oral   Take 1 tablet (5 mg total) by mouth every 8 (eight) hours as needed for muscle spasms. Patient not taking: Reported on 08/15/2014   12 tablet   0   . ibuprofen (ADVIL,MOTRIN) 200 MG tablet   Oral   Take 200 mg by mouth every 6 (six) hours as needed for fever, headache, mild pain or moderate pain.         Marland Kitchen. ketorolac (TORADOL) 10 MG tablet   Oral   Take 1 tablet (10 mg total) by mouth every 8 (eight) hours. Patient not taking: Reported on 08/15/2014   15 tablet   0   . predniSONE (DELTASONE) 20 MG tablet   Oral   Take 2 tablets (40 mg total) by mouth daily with breakfast.   10 tablet   0     Allergies Penicillins  No family history on file.  Social  History Social History  Substance Use Topics  . Smoking status: Current Every Day Smoker -- 0.50 packs/day  . Smokeless tobacco: None  . Alcohol Use: No    Review of Systems Constitutional: Positive fever/chills Eyes: No visual changes. ENT: No sore throat. Cardiovascular: Denies chest pain. Respiratory: Denies shortness of breath. Gastrointestinal: .  No nausea, no vomiting. Resolved diarrhea.  No constipation. Genitourinary: Negative for dysuria. Musculoskeletal: Negative for back pain. Skin: Negative for rash. Neurological: Negative for headaches, focal weakness or numbness.  10-point ROS otherwise negative.  ____________________________________________   PHYSICAL EXAM:  VITAL SIGNS: ED Triage Vitals  Enc Vitals Group     BP 10/27/14 0733 131/75 mmHg     Pulse Rate 10/27/14 0733 73     Resp 10/27/14 0733 14     Temp 10/27/14 0733 98.3 F (36.8 C)     Temp Source 10/27/14 0733 Oral     SpO2 10/27/14 0733 97 %     Weight 10/27/14 0733 180 lb (81.647 kg)     Height 10/27/14 0733 5\' 11"  (1.803 m)     Head Cir --      Peak Flow --      Pain Score 10/27/14 0734 5     Pain Loc --      Pain Edu? --  Excl. in GC? --     Constitutional: Alert and oriented. Well appearing and in no acute distress. Eyes: Conjunctivae are normal. PERRL. EOMI. Head: Atraumatic. Nose: No congestion/rhinnorhea. EACs and TMs are clear bilaterally Mouth/Throat: Mucous membranes are moist.  Oropharynx non-erythematous. Neck: No stridor.  Supple Hematological/Lymphatic/Immunilogical: No cervical lymphadenopathy. Cardiovascular: Normal rate, regular rhythm. Grossly normal heart sounds.  Good peripheral circulation. Respiratory: Normal respiratory effort.  No retractions. Lungs CTAB. Gastrointestinal: Soft and nontender. No distention. No abdominal bruits. No CVA tenderness. Musculoskeletal: No lower extremity tenderness nor edema.  No joint effusions. Neurologic:  Normal speech and  language. No gross focal neurologic deficits are appreciated. No gait instability. Skin:  Skin is warm, dry and intact. No rash noted. Psychiatric: Mood and affect are normal. Speech and behavior are normal.  ____________________________________________   LABS (all labs ordered are listed, but only abnormal results are displayed)  Labs Reviewed - No data to display  PROCEDURES  Procedure(s) performed: None  Critical Care performed: No  ____________________________________________   INITIAL IMPRESSION / ASSESSMENT AND PLAN / ED COURSE  Pertinent labs & imaging results that were available during my care of the patient were reviewed by me and considered in my medical decision making (see chart for details).  Patient is continue taking Tylenol cold as needed for his symptoms along with drinking fluids. He is aware that this is a viral illness and should run its course. ____________________________________________   FINAL CLINICAL IMPRESSION(S) / ED DIAGNOSES  Final diagnoses:  Acute upper respiratory infection  Viral illness      Tommi Rumps, PA-C 10/27/14 1300  Emily Filbert, MD 10/27/14 1312

## 2015-02-14 ENCOUNTER — Encounter: Payer: Self-pay | Admitting: Medical Oncology

## 2015-02-14 ENCOUNTER — Emergency Department
Admission: EM | Admit: 2015-02-14 | Discharge: 2015-02-14 | Disposition: A | Discharge status: Home / Self Care | Payer: No Typology Code available for payment source | Attending: Emergency Medicine | Admitting: Emergency Medicine

## 2015-02-14 DIAGNOSIS — K0889 Other specified disorders of teeth and supporting structures: Secondary | ICD-10-CM | POA: Insufficient documentation

## 2015-02-14 DIAGNOSIS — Z880 Allergy status to penicillin: Secondary | ICD-10-CM | POA: Insufficient documentation

## 2015-02-14 DIAGNOSIS — Z7952 Long term (current) use of systemic steroids: Secondary | ICD-10-CM | POA: Insufficient documentation

## 2015-02-14 DIAGNOSIS — F17200 Nicotine dependence, unspecified, uncomplicated: Secondary | ICD-10-CM | POA: Insufficient documentation

## 2015-02-14 DIAGNOSIS — K029 Dental caries, unspecified: Secondary | ICD-10-CM | POA: Insufficient documentation

## 2015-02-14 MED ORDER — CLINDAMYCIN HCL 150 MG PO CAPS: ORAL_CAPSULE | ORAL | Status: DC

## 2015-02-14 MED ORDER — IBUPROFEN 600 MG PO TABS: 600.0000 mg | ORAL_TABLET | Freq: Three times a day (TID) | ORAL | Status: DC | PRN

## 2015-02-14 NOTE — Discharge Instructions (Signed)
Begin taking your antibiotic and take ibuprofen as needed for pain and inflammation. Contact dental clinics and note that [place] is open for walk-in patients.  OPTIONS FOR DENTAL FOLLOW UP CARE  [place] [facility] Health and Human Services - Local Safety Net Dental Clinics [email or web address]   [facility] [phone])  [facility] Ma [phone])  [place] [phone])  [facility] [phone])  [facility] [phone]) This clinic caters to the indigent population and is on a lottery system. Location: [employer] of Dentistry, [employer], [address] [place] Clinic Hours: Wednesdays from 6pm - 9pm, patients seen by a lottery system. For dates, call or go to [email or web address] Services: Cleanings, fillings and simple extractions. Payment Options: DENTAL WORK IS FREE OF CHARGE. Bring proof of income or support. Best way to get seen: Arrive at 5:15 pm - this is a lottery, NOT first come/first serve, so arriving earlier will not increase your chances of being seen.     [facility] [phone] Select option 1 for emergencies   Location: [facility] of Dentistry, [place], [address] [place] Clinic Hours: No walk-ins accepted - call the day before to schedule an appointment. Check in times are 9:30 am and 1:30 pm. Services: Simple extractions, temporary fillings, pulpectomy/pulp debridement, uncomplicated abscess drainage. Payment Options: PAYMENT IS DUE AT THE TIME OF SERVICE.  Fee is usually $100-200, additional surgical procedures (e.g. abscess drainage) may be extra. Cash, checks, Visa/MasterCard accepted.  Can file Medicaid if patient is covered for dental - patient should call case worker to check. No discount for [facility] patients. Best way to get seen: MUST  call the day before and get onto the schedule. Can usually be seen the next 1-2 days. No walk-ins accepted.     [facility] [phone]   Location: [facility], [address], [place] Clinic Hours: M, W, Th, F 8am or 1:30pm, Tues 9a or 1:30 - first come/first served. Services: Simple extractions, temporary fillings, uncomplicated abscess drainage.  You do not need to be an [facility] resident. Payment Options: PAYMENT IS DUE AT THE TIME OF SERVICE. Dental insurance, otherwise sliding scale - bring proof of income or support. Depending on income and treatment needed, cost is usually $50-200. Best way to get seen: Arrive early as it is first come/first served.     [facility] [phone]   Location: [address] Clinic Hours: Mon-Thu 8a-5p Services: Most basic dental services including extractions and fillings. Payment Options: PAYMENT IS DUE AT THE TIME OF SERVICE. Sliding scale, up to 50% off - bring proof if income or support. Medicaid with dental option accepted. Best way to get seen: Call to schedule an appointment, can usually be seen within 2 weeks OR they will try to see walk-ins - show up at 8a or 2p (you may have to wait).     [facility] [phone] [place] RESIDENTS ONLY   Location: [facility], [address] [place], [place] [address] Clinic Hours: By appointment only. Monday - Thursday 8am-5pm, Friday 8am-12pm Services: Cleanings, fillings, extractions. Payment Options: PAYMENT IS DUE AT THE TIME OF SERVICE. Cash, Visa or MasterCard. Sliding scale - $30 minimum per service. Best way to get seen: Come in to office, complete packet and make an appointment - need proof of income or support monies for each household member and proof of  [facility] residence. Usually takes about a month to get in.     [facility]ental [facility] [phone]   Location: [address] [place] [facility] Clinic Hours: Walk-in Urgent Care Dental Services are offered Monday-Friday mornings only. The numbers of emergencies accepted daily is limited to the number of providers available. Maximum 15 - Mondays, Wednesdays & Thursdays Maximum 10 - Tuesdays & Fridays Services: You do not need to be a [facility] resident to be seen for a dental emergency. Emergencies are defined as pain, swelling, abnormal bleeding, or dental trauma. Walkins will receive x-rays if needed. NOTE: Dental cleaning is not an emergency. Payment Options: PAYMENT IS DUE AT THE TIME OF SERVICE. Minimum co-pay is $40.00 for uninsured patients. Minimum co-pay is $3.00 for Medicaid with dental coverage. Dental Insurance is accepted and must be presented at time of visit. Medicare does not cover dental. Forms of payment: Cash, credit card, checks. Best way to get seen: If not previously registered with the clinic, walk-in dental registration begins at 7:15 am and is on a first come/first serve basis. If previously registered with the clinic, call to make an appointment.     [facility] [phone] [place] RESIDENTS ONLY   Location: [address], [place], [place] Clinic Hours: Mon-Thu 10a-2p Services: Extractions only! Payment Options: FREE (donations accepted) - bring proof of income or support Best way to get seen: Call and schedule an appointment OR come at 8am on the 1st Monday of every month (except for holidays) when it is first come/first served.     [facility] [phone]   Location: [address] [place], [place] Clinic Hours: Friday mornings Services, Payment Options, Best way to get seen: Call for info

## 2015-02-14 NOTE — ED Provider Notes (Signed)
[facility] Emergency Department Provider Note  ____________________________________________  Time seen: Approximately 7:11 AM  I have reviewed the triage vital signs and the nursing notes.   HISTORY  Chief Complaint Dental Pain  HPI Frederick Mendoza is a 40 y.o. male care complaint of left upper tooth pain for 2 days. Patient states that over the weekend he broke his upper left molar off. He has not called to make an appointment as of this date. Patient denies any fever, nausea or vomiting. He is still continued to smoke but has cotton stuffed in his tooth because air causes it to hurt more. Patient does not have a regular dentist. His pain is 10 over 10 and he is most concerned about his tooth being infected.   Past Medical History  Diagnosis Date  . Pneumothorax     There are no active problems to display for this patient.   History reviewed. No pertinent past surgical history.  Current Outpatient Rx  Name  Route  Sig  Dispense  Refill  . albuterol (PROVENTIL HFA;VENTOLIN HFA) 108 (90 BASE) MCG/ACT inhaler   Inhalation   Inhale 2 puffs into the lungs every 6 (six) hours as needed for wheezing or shortness of breath.   1 Inhaler   2   . clindamycin (CLEOCIN) 150 MG capsule      Take 2 tabs qid   56 capsule   0   . cyclobenzaprine (FLEXERIL) 5 MG tablet   Oral   Take 1 tablet (5 mg total) by mouth every 8 (eight) hours as needed for muscle spasms. Patient not taking: Reported on 08/15/2014   12 tablet   0   . ibuprofen (ADVIL,MOTRIN) 600 MG tablet   Oral   Take 1 tablet (600 mg total) by mouth every 8 (eight) hours as needed.   30 tablet   0   . predniSONE (DELTASONE) 20 MG tablet   Oral   Take 2 tablets (40 mg total) by mouth daily with breakfast.   10 tablet   0     Allergies Penicillins  No family history on file.  Social History Social History  Substance Use Topics  . Smoking status: Current Every Day Smoker -- 0.50  packs/day  . Smokeless tobacco: None  . Alcohol Use: No    Review of Systems Constitutional: No fever/chills ENT: No sore throat. Dental pain positive Cardiovascular: Denies chest pain. Respiratory: Denies shortness of breath. Gastrointestinal:  No nausea, no vomiting.   Skin: Negative for rash. Neurological: Negative for headaches  10-point ROS otherwise negative.  ____________________________________________   PHYSICAL EXAM:  VITAL SIGNS: ED Triage Vitals  Enc Vitals Group     BP 02/14/15 0706 126/86 mmHg     Pulse Rate 02/14/15 0706 65     Resp 02/14/15 0706 17     Temp 02/14/15 0706 97.7 F (36.5 C)     Temp Source 02/14/15 0706 Oral     SpO2 02/14/15 0706 98 %     Weight 02/14/15 0706 175 lb (79.379 kg)     Height 02/14/15 0706  (1.803 m)     Head Cir --      Peak Flow --      Pain Score 02/14/15 0707 10     Pain Loc --      Pain Edu? --      Excl. in GC? --     Constitutional: Alert and oriented. Well appearing and in no acute distress. Eyes: Conjunctivae  are normal. PERRL. EOMI. Head: Atraumatic. Nose: No congestion/rhinnorhea. Mouth/Throat: Mucous membranes are moist.  Oropharynx non-erythematous. Left tooth #15 molar with Carrie posteriorly. Gums with minimal edema but no obvious abscess is present. Neck: No stridor.   Hematological/Lymphatic/Immunilogical: No cervical lymphadenopathy. Cardiovascular: Normal rate, regular rhythm. Grossly normal heart sounds.  Good peripheral circulation. Respiratory: Normal respiratory effort.  No retractions. Lungs CTAB. Musculoskeletal: Moves upper and lower extremities without difficulty. Normal gait was noted. Neurologic:  Normal speech and language.  No gait instability. Skin:  Skin is warm, dry and intact. No rash noted. Psychiatric: Mood and affect are normal. Speech and behavior are normal.  ____________________________________________   LABS (all labs ordered are listed, but only abnormal results are  displayed)  Labs Reviewed - No data to display   PROCEDURES  Procedure(s) performed: None  Critical Care performed: No  ____________________________________________   INITIAL IMPRESSION / ASSESSMENT AND PLAN / ED COURSE  Pertinent labs & imaging results that were available during my care of the patient were reviewed by me and considered in my medical decision making (see chart for details).  Patient was given a list of dental clinics in the area that charge per sliding scale. Patient is allergic to penicillin and therefore he is given a prescription for clindamycin and a coupon from [facility] to get his medication and ibuprofen as needed for pain and inflammation. He is encouraged to contact [facility] today as they take walk ins ____________________________________________   FINAL CLINICAL IMPRESSION(S) / ED DIAGNOSES  Final diagnoses:  Tooth ache      Tommi Rumps, PA-C 02/14/15 0809  Minna Antis, MD 03/29/15 1610

## 2015-02-14 NOTE — ED Notes (Signed)
Pt reports left upper tooth ache x 2 days.

## 2015-03-14 ENCOUNTER — Emergency Department: Payer: No Typology Code available for payment source

## 2015-03-14 ENCOUNTER — Emergency Department
Admission: EM | Admit: 2015-03-14 | Discharge: 2015-03-14 | Disposition: A | Discharge status: Home / Self Care | Payer: No Typology Code available for payment source | Attending: Emergency Medicine | Admitting: Emergency Medicine

## 2015-03-14 ENCOUNTER — Encounter: Payer: Self-pay | Admitting: Medical Oncology

## 2015-03-14 DIAGNOSIS — G8929 Other chronic pain: Secondary | ICD-10-CM | POA: Insufficient documentation

## 2015-03-14 DIAGNOSIS — M5416 Radiculopathy, lumbar region: Secondary | ICD-10-CM | POA: Insufficient documentation

## 2015-03-14 DIAGNOSIS — F17200 Nicotine dependence, unspecified, uncomplicated: Secondary | ICD-10-CM | POA: Insufficient documentation

## 2015-03-14 DIAGNOSIS — Z880 Allergy status to penicillin: Secondary | ICD-10-CM | POA: Insufficient documentation

## 2015-03-14 DIAGNOSIS — Z7952 Long term (current) use of systemic steroids: Secondary | ICD-10-CM | POA: Insufficient documentation

## 2015-03-14 MED ORDER — TRAMADOL HCL 50 MG PO TABS: 50.0000 mg | ORAL_TABLET | Freq: Four times a day (QID) | ORAL | Status: DC | PRN

## 2015-03-14 MED ORDER — HYDROMORPHONE HCL 1 MG/ML IJ SOLN
1.0000 mg | Freq: Once | INTRAMUSCULAR | Status: AC
  Administered 2015-03-14: 1 mg via INTRAMUSCULAR
  Filled 2015-03-14: qty 1

## 2015-03-14 MED ORDER — DEXAMETHASONE SODIUM PHOSPHATE 10 MG/ML IJ SOLN
10.0000 mg | Freq: Once | INTRAMUSCULAR | Status: AC
  Administered 2015-03-14: 10 mg via INTRAMUSCULAR
  Filled 2015-03-14: qty 1

## 2015-03-14 MED ORDER — METHYLPREDNISOLONE 4 MG PO TBPK: ORAL_TABLET | ORAL | Status: DC

## 2015-03-14 NOTE — Discharge Instructions (Signed)

## 2015-03-14 NOTE — ED Notes (Signed)
Left lower back pain. Reports hx of sciatica.

## 2015-03-14 NOTE — ED Notes (Signed)
See triage   Hx of sciatica  States he developed left lower back pain couple of days ago  States pain is going down left leg  Ambulates slowly d/t increased pain

## 2015-03-14 NOTE — ED Provider Notes (Signed)
[facility] Emergency Department Provider Note  ____________________________________________  Time seen: Approximately 8:00 AM  I have reviewed the triage vital signs and the nursing notes.   HISTORY  Chief Complaint Back Pain    HPI Frederick Mendoza is a 40 y.o. male patient complaining of radicular low back pain to the left lower extremity. This is history of sciatica. Onset for this complaint 2-3 days ago. Patient denies any bladder or bowel dysfunction. No palliative measures taken for this complaint. Patient rates his pain discomfort as 8/10. Patient described his pain as stinging.   Past Medical History  Diagnosis Date  . Pneumothorax     There are no active problems to display for this patient.   History reviewed. No pertinent past surgical history.  Current Outpatient Rx  Name  Route  Sig  Dispense  Refill  . albuterol (PROVENTIL HFA;VENTOLIN HFA) 108 (90 BASE) MCG/ACT inhaler   Inhalation   Inhale 2 puffs into the lungs every 6 (six) hours as needed for wheezing or shortness of breath.   1 Inhaler   2   . clindamycin (CLEOCIN) 150 MG capsule      Take 2 tabs qid   56 capsule   0   . cyclobenzaprine (FLEXERIL) 5 MG tablet   Oral   Take 1 tablet (5 mg total) by mouth every 8 (eight) hours as needed for muscle spasms. Patient not taking: Reported on 08/15/2014   12 tablet   0   . ibuprofen (ADVIL,MOTRIN) 600 MG tablet   Oral   Take 1 tablet (600 mg total) by mouth every 8 (eight) hours as needed.   30 tablet   0   . methylPREDNISolone (MEDROL DOSEPAK) 4 MG TBPK tablet      Take Tapered dose as directed   21 tablet   0   . predniSONE (DELTASONE) 20 MG tablet   Oral   Take 2 tablets (40 mg total) by mouth daily with breakfast.   10 tablet   0   . traMADol (ULTRAM) 50 MG tablet   Oral   Take 1 tablet (50 mg total) by mouth every 6 (six) hours as needed.   20 tablet   0     Allergies Penicillins  No family history on  file.  Social History Social History  Substance Use Topics  . Smoking status: Current Every Day Smoker -- 0.50 packs/day  . Smokeless tobacco: None  . Alcohol Use: No    Review of Systems Constitutional: No fever/chills Eyes: No visual changes. ENT: No sore throat. Cardiovascular: Denies chest pain. Respiratory: Denies shortness of breath. Gastrointestinal: No abdominal pain.  No nausea, no vomiting.  No diarrhea.  No constipation. Genitourinary: Negative for dysuria. Musculoskeletal: Positive for back pain. Skin: Negative for rash. Neurological: Negative for headaches, focal weakness or numbness. Hematological/Lymphatic: Allergic/Immunilogical: Penicillin  ____________________________________________   PHYSICAL EXAM:  VITAL SIGNS: ED Triage Vitals  Enc Vitals Group     BP 03/14/15 0731 131/77 mmHg     Pulse Rate 03/14/15 0731 60     Resp 03/14/15 0731 18     Temp 03/14/15 0731 98.2 F (36.8 C)     Temp Source 03/14/15 0731 Oral     SpO2 03/14/15 0731 97 %     Weight 03/14/15 0731 170 lb (77.111 kg)     Height 03/14/15 0731 6' (1.829 m)     Head Cir --      Peak Flow --  Pain Score 03/14/15 0734 8     Pain Loc --      Pain Edu? --      Excl. in GC? --     Constitutional: Alert and oriented. Well appearing and in no acute distress. Eyes: Conjunctivae are normal. PERRL. EOMI. Head: Atraumatic. Nose: No congestion/rhinnorhea. Mouth/Throat: Mucous membranes are moist.  Oropharynx non-erythematous. Neck: No stridor.  No cervical spine tenderness to palpation. Hematological/Lymphatic/Immunilogical: No cervical lymphadenopathy. Cardiovascular: Normal rate, regular rhythm. Grossly normal heart sounds.  Good peripheral circulation. Respiratory: Normal respiratory effort.  No retractions. Lungs CTAB. Gastrointestinal: Soft and nontender. No distention. No abdominal bruits. No CVA tenderness. Musculoskeletal: Arms deformity of the L-spine. He has some moderate  guarding at L4-S1. Patient has decreased range of motion with flexion patient has a positive  straight leg raise is approximately 70.Marland Kitchen.  No joint effusions. Neurologic:  Normal speech and language. No gross focal neurologic deficits are appreciated. No gait instability. Skin:  Skin is warm, dry and intact. No rash noted. Psychiatric: Mood and affect are normal. Speech and behavior are normal.  ____________________________________________   LABS (all labs ordered are listed, but only abnormal results are displayed)  Labs Reviewed - No data to display ____________________________________________  EKG   ____________________________________________  RADIOLOGY  Disc space narrowing in place spurs at L4-L5 and L5-S1. ____________________________________________   PROCEDURES  Procedure(s) performed: None  Critical Care performed: No  ____________________________________________   INITIAL IMPRESSION / ASSESSMENT AND PLAN / ED COURSE  Pertinent labs & imaging results that were available during my care of the patient were reviewed by me and considered in my medical decision making (see chart for details).  Left radicular back pain secondary to degenerative changes. Discussed x-ray finding with patient. Advised patient this is of a chronic nature and best followed up by pain management. Patient discharged prescription consist of Medrol Dosepak and tramadol. ____________________________________________   FINAL CLINICAL IMPRESSION(S) / ED DIAGNOSES  Final diagnoses:  Chronic radicular low back pain      Joni ReiningRonald K Smith, PA-C 03/14/15 [id number]  Emily FilbertJonathan E Williams, MD 03/14/15 [phone]

## 2015-05-28 ENCOUNTER — Emergency Department: Payer: No Typology Code available for payment source

## 2015-05-28 ENCOUNTER — Emergency Department
Admission: EM | Admit: 2015-05-28 | Discharge: 2015-05-28 | Disposition: A | Discharge status: Home / Self Care | Payer: No Typology Code available for payment source | Attending: Emergency Medicine | Admitting: Emergency Medicine

## 2015-05-28 ENCOUNTER — Encounter: Payer: Self-pay | Admitting: Emergency Medicine

## 2015-05-28 DIAGNOSIS — R079 Chest pain, unspecified: Secondary | ICD-10-CM | POA: Insufficient documentation

## 2015-05-28 HISTORY — DX: Bursopathy, unspecified: M71.9

## 2015-05-28 LAB — CBC
HEMATOCRIT: 40.4 % (ref 40.0–52.0)
HEMOGLOBIN: 13.5 g/dL (ref 13.0–18.0)
MCH: 30.5 pg (ref 26.0–34.0)
MCHC: 33.5 g/dL (ref 32.0–36.0)
MCV: 90.9 fL (ref 80.0–100.0)
Platelets: 291 10*3/uL (ref 150–440)
RBC: 4.44 MIL/uL (ref 4.40–5.90)
RDW: 13.8 % (ref 11.5–14.5)
WBC: 8 10*3/uL (ref 3.8–10.6)

## 2015-05-28 LAB — BASIC METABOLIC PANEL
ANION GAP: 10 (ref 5–15)
BUN: 11 mg/dL (ref 6–20)
CO2: 22 mmol/L (ref 22–32)
Calcium: 8.8 mg/dL — ABNORMAL LOW (ref 8.9–10.3)
Chloride: 106 mmol/L (ref 101–111)
Creatinine, Ser: 0.89 mg/dL (ref 0.61–1.24)
GFR calc Af Amer: >60 mL/min (ref >60)
GLUCOSE: 127 mg/dL — AB (ref 65–99)
POTASSIUM: 3.9 mmol/L (ref 3.5–5.1)
Sodium: 138 mmol/L (ref 135–145)

## 2015-05-28 LAB — TROPONIN I: Troponin I: <0.03 ng/mL (ref <0.031)

## 2015-05-28 NOTE — ED Notes (Signed)
Called twice for pt to take to room but no answer

## 2015-05-28 NOTE — ED Notes (Signed)
C/O chest pain.  C/O left shoulder pain radiating to left arm.  Onset of symptoms 1 year.  Pain worse with deep breathing and movement. Also c/o SOB.

## 2015-07-26 ENCOUNTER — Emergency Department
Admission: EM | Admit: 2015-07-26 | Discharge: 2015-07-26 | Disposition: A | Discharge status: Home / Self Care | Payer: No Typology Code available for payment source | Attending: Emergency Medicine | Admitting: Emergency Medicine

## 2015-07-26 ENCOUNTER — Encounter: Payer: Self-pay | Admitting: Emergency Medicine

## 2015-07-26 DIAGNOSIS — F17200 Nicotine dependence, unspecified, uncomplicated: Secondary | ICD-10-CM | POA: Insufficient documentation

## 2015-07-26 DIAGNOSIS — M5440 Lumbago with sciatica, unspecified side: Secondary | ICD-10-CM | POA: Insufficient documentation

## 2015-07-26 DIAGNOSIS — R55 Syncope and collapse: Secondary | ICD-10-CM | POA: Insufficient documentation

## 2015-07-26 DIAGNOSIS — M5430 Sciatica, unspecified side: Secondary | ICD-10-CM

## 2015-07-26 LAB — CBC WITH DIFFERENTIAL/PLATELET
BASOS ABS: 0.1 10*3/uL (ref 0–0.1)
BASOS PCT: 1 %
EOS PCT: 6 %
Eosinophils Absolute: 0.4 10*3/uL (ref 0–0.7)
HEMATOCRIT: 39.6 % — AB (ref 40.0–52.0)
HEMOGLOBIN: 13.8 g/dL (ref 13.0–18.0)
LYMPHS ABS: 1.7 10*3/uL (ref 1.0–3.6)
LYMPHS PCT: 25 %
MCH: 32.2 pg (ref 26.0–34.0)
MCHC: 35 g/dL (ref 32.0–36.0)
MCV: 92.1 fL (ref 80.0–100.0)
MONO ABS: 0.9 10*3/uL (ref 0.2–1.0)
MONOS PCT: 13 %
NEUTROS ABS: 4 10*3/uL (ref 1.4–6.5)
Neutrophils Relative %: 55 %
Platelets: 284 10*3/uL (ref 150–440)
RBC: 4.3 MIL/uL — ABNORMAL LOW (ref 4.40–5.90)
RDW: 13.9 % (ref 11.5–14.5)
WBC: 7.1 10*3/uL (ref 3.8–10.6)

## 2015-07-26 LAB — COMPREHENSIVE METABOLIC PANEL
ALBUMIN: 4.5 g/dL (ref 3.5–5.0)
ALK PHOS: 55 U/L (ref 38–126)
ALT: 24 U/L (ref 17–63)
AST: 28 U/L (ref 15–41)
Anion gap: 8 (ref 5–15)
BILIRUBIN TOTAL: 0.2 mg/dL — AB (ref 0.3–1.2)
BUN: 11 mg/dL (ref 6–20)
CO2: 23 mmol/L (ref 22–32)
Calcium: 9.1 mg/dL (ref 8.9–10.3)
Chloride: 104 mmol/L (ref 101–111)
Creatinine, Ser: 0.71 mg/dL (ref 0.61–1.24)
GFR calc Af Amer: >60 mL/min (ref >60)
GFR calc non Af Amer: >60 mL/min (ref >60)
GLUCOSE: 126 mg/dL — AB (ref 65–99)
POTASSIUM: 4.2 mmol/L (ref 3.5–5.1)
Sodium: 135 mmol/L (ref 135–145)
TOTAL PROTEIN: 7.4 g/dL (ref 6.5–8.1)

## 2015-07-26 LAB — TROPONIN I: Troponin I: <0.03 ng/mL (ref <0.03)

## 2015-07-26 MED ORDER — ONDANSETRON 4 MG PO TBDP
4.0000 mg | ORAL_TABLET | Freq: Once | ORAL | Status: AC
  Administered 2015-07-26: 4 mg via ORAL

## 2015-07-26 MED ORDER — BACLOFEN 10 MG PO TABS: 10.0000 mg | ORAL_TABLET | Freq: Three times a day (TID) | ORAL | Status: DC

## 2015-07-26 MED ORDER — OXYCODONE-ACETAMINOPHEN 5-325 MG PO TABS: 1.0000 | ORAL_TABLET | ORAL | Status: DC | PRN

## 2015-07-26 MED ORDER — KETOROLAC TROMETHAMINE 60 MG/2ML IM SOLN
60.0000 mg | Freq: Once | INTRAMUSCULAR | Status: AC
  Administered 2015-07-26: 60 mg via INTRAMUSCULAR
  Filled 2015-07-26: qty 2

## 2015-07-26 MED ORDER — SODIUM CHLORIDE 0.9 % IV SOLN
Freq: Once | INTRAVENOUS | Status: AC
  Administered 2015-07-26: 10:00:00 via INTRAVENOUS

## 2015-07-26 MED ORDER — ONDANSETRON 4 MG PO TBDP
ORAL_TABLET | ORAL | Status: AC
  Administered 2015-07-26: 4 mg via ORAL
  Filled 2015-07-26: qty 1

## 2015-07-26 MED ORDER — ATROPINE SULFATE 1 MG/10ML IJ SOSY
PREFILLED_SYRINGE | INTRAMUSCULAR | Status: AC
  Filled 2015-07-26: qty 10

## 2015-07-26 MED ORDER — NAPROXEN 500 MG PO TABS: 500.0000 mg | ORAL_TABLET | Freq: Two times a day (BID) | ORAL | Status: DC

## 2015-07-26 MED ORDER — METHYLPREDNISOLONE SODIUM SUCC 40 MG IJ SOLR
40.0000 mg | Freq: Once | INTRAMUSCULAR | Status: AC
  Administered 2015-07-26: 40 mg via INTRAMUSCULAR
  Filled 2015-07-26: qty 1

## 2015-07-26 NOTE — ED Notes (Signed)
Hx of chronic back pain , left side back pain radiating down left buttock worse today , increased pain x1 week, no new injury, " I have pain daily, and I just push through it normally ibuprofen helps with the inflammation, took 400mg  ibuprofen today, pt ambulatory with a slow limping gait. " it hurt  So bad today I could not bend over to tie my shoes.

## 2015-07-26 NOTE — ED Notes (Addendum)
Pt complaining of nausea , diaphoretic, pale

## 2015-07-26 NOTE — ED Notes (Addendum)
Pt administered zofran ODT, pt reclined back in the stretcher, remaining alert and oriented x4 ,  pale and diaphoretic, no respiratory distress, pt with a palpable pulse in the forties, full set of vitals and EKG to be obtained , provider Flowery Branchhuck at bedside.  ,informed charge RN stephaine for major bed, room 8 was given

## 2015-07-26 NOTE — ED Provider Notes (Addendum)
I have assumed care of the patient after he appeared to have a vasovagal event with low heart rate and blood pressure. Patient became diaphoretic after steroid and pain shots. I will give IV fluid, check basic labs and observe.  EKG: Interpreted by me, sinus bradycardia with a rate of 46 bpm, early repolarization, normal QT interval. Normal axis.    Labs Reviewed  CBC WITH DIFFERENTIAL/PLATELET - Abnormal; Notable for the following:    RBC 4.30 (*)    HCT 39.6 (*)    All other components within normal limits  COMPREHENSIVE METABOLIC PANEL - Abnormal; Notable for the following:    Glucose, Bld 126 (*)    Total Bilirubin 0.2 (*)    All other components within normal limits  TROPONIN I   Patient is currently feeling better, appears to be back to his baseline after observation and saline. He'll be discharged with pain medicine, steroids and close outpatient follow-up.  Emily FilbertJonathan E Lavonn Maxcy, MD 07/26/15 [id number]  Emily FilbertJonathan E Sovereign Ramiro, MD 07/26/15 1055

## 2015-07-26 NOTE — Discharge Instructions (Signed)
Neuropathic Pain Neuropathic pain is pain caused by damage to the nerves that are responsible for certain sensations in your body (sensory nerves). The pain can be caused by damage to:   The sensory nerves that send signals to your spinal cord and brain (peripheral nervous system).  The sensory nerves in your brain or spinal cord (central nervous system). Neuropathic pain can make you more sensitive to pain. What would be a minor sensation for most people may feel very painful if you have neuropathic pain. This is usually a long-term condition that can be difficult to treat. The type of pain can differ from person to person. It may start suddenly (acute), or it may develop slowly and last for a long time (chronic). Neuropathic pain may come and go as damaged nerves heal or may stay at the same level for years. It often causes emotional distress, loss of sleep, and a lower quality of life. CAUSES  The most common cause of damage to a sensory nerve is diabetes. Many other diseases and conditions can also cause neuropathic pain. Causes of neuropathic pain can be classified as:  Toxic. Many drugs and chemicals can cause toxic damage. The most common cause of toxic neuropathic pain is damage from drug treatment for cancer (chemotherapy).  Metabolic. This type of pain can happen when a disease causes imbalances that damage nerves. Diabetes is the most common of these diseases. Vitamin B deficiency caused by long-term alcohol abuse is another common cause.  Traumatic. Any injury that cuts, crushes, or stretches a nerve can cause damage and pain. A common example is feeling pain after losing an arm or leg (phantom limb pain).  Compression-related. If a sensory nerve gets trapped or compressed for a long period of time, the blood supply to the nerve can be cut off.  Vascular. Many blood vessel diseases can cause neuropathic pain by decreasing blood supply and oxygen to nerves.  Autoimmune. This type of  pain results from diseases in which the body's defense system mistakenly attacks sensory nerves. Examples of autoimmune diseases that can cause neuropathic pain include lupus and multiple sclerosis.  Infectious. Many types of viral infections can damage sensory nerves and cause pain. Shingles infection is a common cause of this type of pain.  Inherited. Neuropathic pain can be a symptom of many diseases that are passed down through families (genetic). SIGNS AND SYMPTOMS  The main symptom is pain. Neuropathic pain is often described as:  Burning.  Shock-like.  Stinging.  Hot or cold.  Itching. DIAGNOSIS  No single test can diagnose neuropathic pain. Your health care provider will do a physical exam and ask you about your pain. You may use a pain scale to describe how bad your pain is. You may also have tests to see if you have a high sensitivity to pain and to help find the cause and location of any sensory nerve damage. These tests may include:  Imaging studies, such as:  X-rays.  CT scan.  MRI.  Nerve conduction studies to test how well nerve signals travel through your sensory nerves (electrodiagnostic testing).  Stimulating your sensory nerves through electrodes on your skin and measuring the response in your spinal cord and brain (somatosensory evoked potentials). TREATMENT  Treatment for neuropathic pain may change over time. You may need to try different treatment options or a combination of treatments. Some options include:  Over-the-counter pain relievers.  Prescription medicines. Some medicines used to treat other conditions may also help neuropathic pain. These  include medicines to:  Control seizures (anticonvulsants).  Relieve depression (antidepressants).  Prescription-strength pain relievers (narcotics). These are usually used when other pain relievers do not help.  Transcutaneous nerve stimulation (TENS). This uses electrical currents to block painful nerve  signals. The treatment is painless.  Topical and local anesthetics. These are medicines that numb the nerves. They can be injected as a nerve block or applied to the skin.  Alternative treatments, such as:  Acupuncture.  Meditation.  Massage.  Physical therapy.  Pain management programs.  Counseling. HOME CARE INSTRUCTIONS  Learn as much as you can about your condition.  Take medicines only as directed by your health care provider.  Work closely with all your health care providers to find what works best for you.  Have a good support system at home.  Consider joining a chronic pain support group. SEEK MEDICAL CARE IF:  Your pain treatments are not helping.  You are having side effects from your medicines.  You are struggling with fatigue, mood changes, depression, or anxiety.   This information is not intended to replace advice given to you by your health care provider. Make sure you discuss any questions you have with your health care provider.   Document Released: 09/21/2003 Document Revised: 01/14/2014 Document Reviewed: 06/03/2013 Elsevier Interactive Patient Education 2016 [employer]  Sciatica Sciatica is pain, weakness, numbness, or tingling along the path of the sciatic nerve. The nerve starts in the lower back and runs down the back of each leg. The nerve controls the muscles in the lower leg and in the back of the knee, while also providing sensation to the back of the thigh, lower leg, and the sole of your foot. Sciatica is a symptom of another medical condition. For instance, nerve damage or certain conditions, such as a herniated disk or bone spur on the spine, pinch or put pressure on the sciatic nerve. This causes the pain, weakness, or other sensations normally associated with sciatica. Generally, sciatica only affects one side of the body. CAUSES   Herniated or slipped disc.  Degenerative disk disease.  A pain disorder involving the narrow muscle  in the buttocks (piriformis syndrome).  Pelvic injury or fracture.  Pregnancy.  Tumor (rare). SYMPTOMS  Symptoms can vary from mild to very severe. The symptoms usually travel from the low back to the buttocks and down the back of the leg. Symptoms can include:  Mild tingling or dull aches in the lower back, leg, or hip.  Numbness in the back of the calf or sole of the foot.  Burning sensations in the lower back, leg, or hip.  Sharp pains in the lower back, leg, or hip.  Leg weakness.  Severe back pain inhibiting movement. These symptoms may get worse with coughing, sneezing, laughing, or prolonged sitting or standing. Also, being overweight may worsen symptoms. DIAGNOSIS  Your caregiver will perform a physical exam to look for common symptoms of sciatica. He or she may ask you to do certain movements or activities that would trigger sciatic nerve pain. Other tests may be performed to find the cause of the sciatica. These may include:  Blood tests.  X-rays.  Imaging tests, such as an MRI or CT scan. TREATMENT  Treatment is directed at the cause of the sciatic pain. Sometimes, treatment is not necessary and the pain and discomfort goes away on its own. If treatment is needed, your caregiver may suggest:  Over-the-counter medicines to relieve pain.  Prescription medicines, such as anti-inflammatory medicine,  muscle relaxants, or narcotics.  Applying heat or ice to the painful area.  Steroid injections to lessen pain, irritation, and inflammation around the nerve.  Reducing activity during periods of pain.  Exercising and stretching to strengthen your abdomen and improve flexibility of your spine. Your caregiver may suggest losing weight if the extra weight makes the back pain worse.  Physical therapy.  Surgery to eliminate what is pressing or pinching the nerve, such as a bone spur or part of a herniated disk. HOME CARE INSTRUCTIONS   Only take over-the-counter or  prescription medicines for pain or discomfort as directed by your caregiver.  Apply ice to the affected area for 20 minutes, 3-4 times a day for the first 48-72 hours. Then try heat in the same way.  Exercise, stretch, or perform your usual activities if these do not aggravate your pain.  Attend physical therapy sessions as directed by your caregiver.  Keep all follow-up appointments as directed by your caregiver.  Do not wear high heels or shoes that do not provide proper support.  Check your mattress to see if it is too soft. A firm mattress may lessen your pain and discomfort. SEEK IMMEDIATE MEDICAL CARE IF:   You lose control of your bowel or bladder (incontinence).  You have increasing weakness in the lower back, pelvis, buttocks, or legs.  You have redness or swelling of your back.  You have a burning sensation when you urinate.  You have pain that gets worse when you lie down or awakens you at night.  Your pain is worse than you have experienced in the past.  Your pain is lasting longer than 4 weeks.  You are suddenly losing weight without reason. MAKE SURE YOU:  Understand these instructions.  Will watch your condition.  Will get help right away if you are not doing well or get worse.   This information is not intended to replace advice given to you by your health care provider. Make sure you discuss any questions you have with your health care provider.   Document Released: 12/18/2000 Document Revised: 09/14/2014 Document Reviewed: 05/05/2011 Elsevier Interactive Patient Education [employer]

## 2015-07-26 NOTE — ED Notes (Signed)
Pt with sciatica flare up

## 2015-07-26 NOTE — ED Provider Notes (Signed)
[facility] Emergency Department Provider Note  ____________________________________________  Time seen: Approximately 8:12 AM  I have reviewed the triage vital signs and the nursing notes.   HISTORY  Chief Complaint Sciatica    HPI Frederick Mendoza is a 40 y.o. male presents for evaluation of sciatica flareup. Patient has reports past medical history of same.Complaining of radicular low back pain to the left lower extremity. There is history of sciatica. Onset for this complaint 2-3 days ago. Patient denies any bladder or bowel dysfunction. No palliative measures taken for this complaint. Patient rates his pain discomfort as 10/10. Patient described his pain as stinging.He denies any recent trauma. She denies any groin or numbness or paresthesia.   Past Medical History:  Diagnosis Date  . Bursitis    left shoulder  . Pneumothorax     There are no active problems to display for this patient.   History reviewed. No pertinent surgical history.  Current Outpatient Rx  . Order #: [id number] Class: Print  . Order #: [id number] Class: Print  . Order #: [id number] Class: Print  . Order #: [id number] Class: Print    Allergies Penicillins  No family history on file.  Social History Social History  Substance Use Topics  . Smoking status: Current Every Day Smoker    Packs/day: 0.50  . Smokeless tobacco: Not on file  . Alcohol use Yes    Review of Systems Constitutional: No fever/chills Eyes: No visual changes. ENT: No sore throat. Cardiovascular: Denies chest pain. Respiratory: Denies shortness of breath. Gastrointestinal: No abdominal pain.  No nausea, no vomiting.  No diarrhea.  No constipation. Genitourinary: Negative for dysuria. Musculoskeletal: Positive for back pain. Skin: Negative for rash. Neurological: Negative for headaches, focal weakness or numbness.  10-point ROS otherwise  negative.  ____________________________________________   PHYSICAL EXAM:  VITAL SIGNS: ED Triage Vitals  Enc Vitals Group     BP 07/26/15 0804 120/73 mmHg     Pulse Rate 07/26/15 0804 72     Resp 07/26/15 0804 18     Temp 07/26/15 0804 98.1 F (36.7 C)     Temp Source 07/26/15 0804 Oral     SpO2 07/26/15 0804 97 %     Weight 07/26/15 0804 170 lb (77.111 kg)     Height 07/26/15 0804  (1.803 m)     Head Cir --      Peak Flow --      Pain Score 07/26/15 0802 10     Pain Loc --      Pain Edu? --      Excl. in GC? --     Constitutional: Alert and oriented. Well appearing and in no acute distress.  Cardiovascular: Normal rate, regular rhythm. Grossly normal heart sounds.  Good peripheral circulation. Respiratory: Normal respiratory effort.  No retractions. Lungs CTAB. Gastrointestinal: Soft and nontender. No distention. No abdominal bruits. No CVA tenderness. Musculoskeletal: Point tenderness to lumbar and paralumbar spinal processes. Straight leg raise positive at about 60 bilaterally. Distally neurovascularly intact. Neurologic:  Normal speech and language. No gross focal neurologic deficits are appreciated. No gait instability. Skin:  Skin is warm, dry and intact. No rash noted. Psychiatric: Mood and affect are normal. Speech and behavior are normal.  ____________________________________________   LABS (all labs ordered are listed, but only abnormal results are displayed)  Labs Reviewed  CBC WITH DIFFERENTIAL/PLATELET - Abnormal; Notable for the following:       Result Value   RBC 4.30 (*)    HCT  39.6 (*)    All other components within normal limits  COMPREHENSIVE METABOLIC PANEL - Abnormal; Notable for the following:    Glucose, Bld 126 (*)    Total Bilirubin 0.2 (*)    All other components within normal limits  TROPONIN I   ____________________________________________  EKG   ____________________________________________  RADIOLOGY  IMPRESSION:   Degenerative disc and facet disease changes at lower lumbar spine.  Minimal anterior height loss of L5 vertebral body, question old as above.  No other definite focal bony abnormalities. ____________________________________________   PROCEDURES  Procedure(s) performed: None  Critical Care performed: No  ____________________________________________   INITIAL IMPRESSION / ASSESSMENT AND PLAN / ED COURSE  Pertinent labs & imaging results that were available during my care of the patient were reviewed by me and considered in my medical decision making (see chart for details).  Acute exacerbation of sciatica. Rx given for tapering dose of prednisone as well as an Rx for Percocet 5/325,  baclofen as needed for muscle relaxer. Work excuse 48 hours given. Patient follow-up with PCP, pain management. He voices no other emergency medical complaints this time After Solu-Medrol and Toradol injection, patient has become very diaphoretic, extremely nauseated and weak. Vital signs obtain pulse rate running between 40 and 50. EKG obtained. We'll transfer patient over to the main ED B side. Report given to attending provider.. ____________________________________________   FINAL CLINICAL IMPRESSION(S) / ED DIAGNOSES  Final diagnoses:  Sciatica, unspecified laterality  Vasovagal near syncope     This chart was dictated using voice recognition software/Dragon. Despite best efforts to proofread, errors can occur which can change the meaning. Any change was purely unintentional.   Evangeline Dakinharles M Parlee Amescua, PA-C 07/26/15 0827  Evangeline Dakinharles M Jaxton Casale, PA-C 08/18/15 1251  Emily FilbertJonathan E Williams, MD 08/19/15 [phone]

## 2015-07-26 NOTE — ED Notes (Signed)
Called assigned RN Nicki Guadalajararicia with report of pt coming from Pod D to room 8.

## 2015-09-12 ENCOUNTER — Emergency Department: Payer: Self-pay

## 2015-09-12 ENCOUNTER — Encounter: Payer: Self-pay | Admitting: Emergency Medicine

## 2015-09-12 ENCOUNTER — Emergency Department
Admission: EM | Admit: 2015-09-12 | Discharge: 2015-09-12 | Disposition: A | Discharge status: Home / Self Care | Payer: Self-pay | Attending: Emergency Medicine | Admitting: Emergency Medicine

## 2015-09-12 DIAGNOSIS — W01198A Fall on same level from slipping, tripping and stumbling with subsequent striking against other object, initial encounter: Secondary | ICD-10-CM | POA: Insufficient documentation

## 2015-09-12 DIAGNOSIS — Y929 Unspecified place or not applicable: Secondary | ICD-10-CM | POA: Insufficient documentation

## 2015-09-12 DIAGNOSIS — M545 Low back pain, unspecified: Secondary | ICD-10-CM

## 2015-09-12 DIAGNOSIS — F17200 Nicotine dependence, unspecified, uncomplicated: Secondary | ICD-10-CM | POA: Insufficient documentation

## 2015-09-12 DIAGNOSIS — Y999 Unspecified external cause status: Secondary | ICD-10-CM | POA: Insufficient documentation

## 2015-09-12 DIAGNOSIS — S300XXA Contusion of lower back and pelvis, initial encounter: Secondary | ICD-10-CM | POA: Insufficient documentation

## 2015-09-12 DIAGNOSIS — Y9389 Activity, other specified: Secondary | ICD-10-CM | POA: Insufficient documentation

## 2015-09-12 DIAGNOSIS — M62830 Muscle spasm of back: Secondary | ICD-10-CM

## 2015-09-12 MED ORDER — IBUPROFEN 800 MG PO TABS: 800.0000 mg | ORAL_TABLET | Freq: Once | ORAL | Status: DC

## 2015-09-12 MED ORDER — KETOROLAC TROMETHAMINE 30 MG/ML IJ SOLN
30.0000 mg | Freq: Once | INTRAMUSCULAR | Status: DC
  Filled 2015-09-12: qty 1

## 2015-09-12 MED ORDER — CYCLOBENZAPRINE HCL 10 MG PO TABS: 10.0000 mg | ORAL_TABLET | Freq: Three times a day (TID) | ORAL | 0 refills | Status: DC | PRN

## 2015-09-12 MED ORDER — TRAMADOL HCL 50 MG PO TABS: 50.0000 mg | ORAL_TABLET | Freq: Four times a day (QID) | ORAL | 0 refills | Status: DC | PRN

## 2015-09-12 MED ORDER — IBUPROFEN 800 MG PO TABS: 800.0000 mg | ORAL_TABLET | Freq: Three times a day (TID) | ORAL | 0 refills | Status: DC | PRN

## 2015-09-12 NOTE — ED Triage Notes (Signed)
Pt reports falling backwards out of tub this morning, reports lower back pain. Pt ambulatory to triage, denies hitting head.

## 2015-09-12 NOTE — ED Provider Notes (Signed)
[facility] Emergency Department Provider Note  ____________________________________________  Time seen: Approximately 8:48 AM  I have reviewed the triage vital signs and the nursing notes.   HISTORY  Chief Complaint Back Pain    HPI Frederick Mendoza is a 40 y.o. male , NAD, presents to the emergency department with several hour history of lower back pain. States he was getting out of the shower, slipped and fell backwards hitting his back on the edge of his bathtub. States he has a chronic history of lower back pain and sciatica and feels this has flared his lower back pain. Denies any radiation of pain, numbness, weakness, tingling, saddle paresthesias nor loss of bowel or bladder control. Patient denies head injury, LOC, neck pain, dizziness or visual changes. Has not had any chest pain, shortness of breath, abdominal pain, nausea, vomiting. Has not taken anything for his pain at this time. Has not noted any open wounds or lacerations.    Past Medical History:  Diagnosis Date  . Bursitis    left shoulder  . Pneumothorax     There are no active problems to display for this patient.   No past surgical history on file.  Prior to Admission medications   Medication Sig Start Date End Date Taking? Authorizing Provider  cyclobenzaprine (FLEXERIL) 10 MG tablet Take 1 tablet (10 mg total) by mouth 3 (three) times daily as needed for muscle spasms. 09/12/15   Costantino Kohlbeck L Loreen Bankson, PA-C  ibuprofen (ADVIL,MOTRIN) 800 MG tablet Take 1 tablet (800 mg total) by mouth every 8 (eight) hours as needed (pain). 09/12/15   Laasya Peyton L Lismary Kiehn, PA-C  traMADol (ULTRAM) 50 MG tablet Take 1 tablet (50 mg total) by mouth every 6 (six) hours as needed. 09/12/15   Adiba Fargnoli L Liam Cammarata, PA-C    Allergies Penicillins; Other; and Toradol [ketorolac tromethamine]  No family history on file.  Social History Social History  Substance Use Topics  . Smoking status: Current Every Day Smoker    Packs/day: 0.50   . Smokeless tobacco: Not on file  . Alcohol use Yes     Review of Systems  Constitutional: No fever/chills Eyes: No visual changes.  Cardiovascular: No chest pain. Respiratory: No shortness of breath. No wheezing.  Gastrointestinal: No abdominal pain.  No nausea, vomiting.  Genitourinary: Negative for dysuria, hematuria.  Musculoskeletal: Positive for back pain.  Skin: Positive abrasion or back. Negative for rash, redness, open wounds, skin sores. Neurological: Negative for headaches, focal weakness or numbness. No tingling. No saddle paresthesias, loss of bowel or bladder control. No head injury, LOC, dizziness. 10-point ROS otherwise negative.  ____________________________________________   PHYSICAL EXAM:  VITAL SIGNS: ED Triage Vitals [09/12/15 0827]  Enc Vitals Group     BP 125/75     Pulse Rate 70     Resp 17     Temp 98.3 F (36.8 C)     Temp Source Oral     SpO2 99 %     Weight 170 lb (77.1 kg)     Height 5\' 11"  (1.803 m)     Head Circumference      Peak Flow      Pain Score 10     Pain Loc      Pain Edu?      Excl. in GC?      Constitutional: Alert and oriented. Well appearing and in no acute distress. Eyes: Conjunctivae are normal without icterus or injection. Head: Atraumatic. Neck: Supple with full range of  motion. No cervical spine tenderness to palpation. No trapezial muscle spasm. Hematological/Lymphatic/Immunilogical: No cervical lymphadenopathy. Cardiovascular: Good peripheral circulation. Respiratory: Normal respiratory effort without tachypnea or retractions.  Musculoskeletal: Tenderness to palpation diffusely about the lumbar spine as well as paraspinal regions. No SI joint tenderness bilaterally. Full range of motion of the lower extremities without difficulty but pain with bilateral straight leg raise. No lower extremity tenderness nor edema.  No joint effusions. Neurologic:  Normal speech and language. No gross focal neurologic deficits are  appreciated.  Skin:  Contusion noted to the upper lumbar area with superficial abrasion that is not bleeding. Skin is warm, dry. No rash noted. Psychiatric: Mood and affect are normal. Speech and behavior are normal. Patient exhibits appropriate insight and judgement.   ____________________________________________   LABS  None ____________________________________________  EKG  None ____________________________________________  RADIOLOGY I have personally viewed and evaluated these images (plain radiographs) as part of my medical decision making, as well as reviewing the written report by the radiologist.  Dg Lumbar Spine 2-3 Views  Result Date: 09/12/2015 CLINICAL DATA:  Patient slipped in shower this morning it and is having low back pain with no radiation to the lower extremities. History of degenerative disc disease and sciatic symptoms. EXAM: LUMBAR SPINE - 2-3 VIEW COMPARISON:  Lumbar spine series of March 14, 2015 FINDINGS: The lumbar vertebral bodies are preserved in height with exception of L5 where there is chronic approximately 15-20% compression anteriorly and posteriorly. There is disc space narrowing at L4-5 and L5-S1 which is stable. There are endplate osteophytes at both levels. The pedicles and transverse processes are intact. There is no spondylolisthesis. There is chronic acute angulation at the sacrococcygeal junction. IMPRESSION: Chronic degenerative changes at L4-5 and L5-S1. Chronic partial compression of L5. There is no acute bony abnormality. Electronically Signed   By: David  [place]az[place] M.D.   On: 09/12/2015 09:12    ____________________________________________    PROCEDURES  Procedure(s) performed: None   Procedures   Medications  ibuprofen (ADVIL,MOTRIN) tablet 800 mg (800 mg Oral Refused 09/12/15 0919)     ____________________________________________   INITIAL IMPRESSION / ASSESSMENT AND PLAN / ED COURSE  Pertinent labs & imaging results that were  available during my care of the patient were reviewed by me and considered in my medical decision making (see chart for details).  Clinical Course    Patient's diagnosis is consistent with Contusion of lower back, bilateral lower back pain without sciatica and muscle spasm of the back. Patient will be discharged home with prescriptions for Flexeril, ibuprofen and Ultram to take as directed. Patient is to apply ice to the affected area 20 minutes 3-4 times daily complete light range of motion and stretching exercises as discussed. Patient is to follow up with his primary care provider if symptoms persist past this treatment course. Patient is given ED precautions to return to the ED for any worsening or new symptoms.      ____________________________________________  FINAL CLINICAL IMPRESSION(S) / ED DIAGNOSES  Final diagnoses:  Contusion of lower back, initial encounter  Bilateral low back pain without sciatica  Muscle spasm of back      NEW MEDICATIONS STARTED DURING THIS VISIT:  Discharge Medication List as of 09/12/2015  9:36 AM    START taking these medications   Details  cyclobenzaprine (FLEXERIL) 10 MG tablet Take 1 tablet (10 mg total) by mouth 3 (three) times daily as needed for muscle spasms., Starting Tue 09/12/2015, Print    ibuprofen (ADVIL,MOTRIN) 800 MG  tablet Take 1 tablet (800 mg total) by mouth every 8 (eight) hours as needed (pain)., Starting Tue 09/12/2015, Print    traMADol (ULTRAM) 50 MG tablet Take 1 tablet (50 mg total) by mouth every 6 (six) hours as needed., Starting Tue 09/12/2015, Print             Ernestene Kiel Dundas, PA-C 09/12/15 1037    Emily Filbert, MD 09/12/15 [phone]

## 2015-09-12 NOTE — ED Notes (Addendum)
When into patient room to administer IM injection, pt reports that either Toradol or administered steriods was the cause of his vasovagal event in July. Pt reports that he was given steroids and Toradol, then had to be transferred over to major ER for evaluation and EKG. Pt reports becoming very diaphoretic and weak after injections. PA informed, pt offered ibuprofen. Pt refused.

## 2015-09-12 NOTE — ED Notes (Signed)
States he slipped in the shower this am   Having pain to lower back  Pain is non radiating  Ambulates well treatment room

## 2015-09-12 NOTE — ED Notes (Signed)
Pt back from xray and ambulates to bathroom

## 2016-02-12 ENCOUNTER — Emergency Department
Admission: EM | Admit: 2016-02-12 | Discharge: 2016-02-12 | Disposition: A | Discharge status: Home / Self Care | Payer: 59 | Attending: Emergency Medicine | Admitting: Emergency Medicine

## 2016-02-12 ENCOUNTER — Encounter: Payer: Self-pay | Admitting: Emergency Medicine

## 2016-02-12 ENCOUNTER — Emergency Department: Payer: 59

## 2016-02-12 DIAGNOSIS — W19XXXA Unspecified fall, initial encounter: Secondary | ICD-10-CM

## 2016-02-12 DIAGNOSIS — S80212A Abrasion, left knee, initial encounter: Secondary | ICD-10-CM | POA: Diagnosis not present

## 2016-02-12 DIAGNOSIS — S80211A Abrasion, right knee, initial encounter: Secondary | ICD-10-CM | POA: Diagnosis not present

## 2016-02-12 DIAGNOSIS — Y999 Unspecified external cause status: Secondary | ICD-10-CM | POA: Diagnosis not present

## 2016-02-12 DIAGNOSIS — W108XXA Fall (on) (from) other stairs and steps, initial encounter: Secondary | ICD-10-CM | POA: Insufficient documentation

## 2016-02-12 DIAGNOSIS — Y929 Unspecified place or not applicable: Secondary | ICD-10-CM | POA: Diagnosis not present

## 2016-02-12 DIAGNOSIS — F17200 Nicotine dependence, unspecified, uncomplicated: Secondary | ICD-10-CM | POA: Insufficient documentation

## 2016-02-12 DIAGNOSIS — S301XXA Contusion of abdominal wall, initial encounter: Secondary | ICD-10-CM | POA: Diagnosis not present

## 2016-02-12 DIAGNOSIS — Y9389 Activity, other specified: Secondary | ICD-10-CM | POA: Insufficient documentation

## 2016-02-12 DIAGNOSIS — S3991XA Unspecified injury of abdomen, initial encounter: Secondary | ICD-10-CM | POA: Diagnosis present

## 2016-02-12 DIAGNOSIS — S20219A Contusion of unspecified front wall of thorax, initial encounter: Secondary | ICD-10-CM | POA: Diagnosis not present

## 2016-02-12 LAB — URINALYSIS, COMPLETE (UACMP) WITH MICROSCOPIC
Bacteria, UA: NONE SEEN
Bilirubin Urine: NEGATIVE
GLUCOSE, UA: NEGATIVE mg/dL
Hgb urine dipstick: NEGATIVE
KETONES UR: NEGATIVE mg/dL
Leukocytes, UA: NEGATIVE
Nitrite: NEGATIVE
PH: 5 (ref 5.0–8.0)
PROTEIN: NEGATIVE mg/dL
RBC / HPF: NONE SEEN RBC/hpf (ref 0–5)
SQUAMOUS EPITHELIAL / LPF: NONE SEEN
Specific Gravity, Urine: 1.016 (ref 1.005–1.030)
WBC, UA: NONE SEEN WBC/hpf (ref 0–5)

## 2016-02-12 MED ORDER — TRAMADOL HCL 50 MG PO TABS: 50.0000 mg | ORAL_TABLET | ORAL | 0 refills | Status: DC | PRN

## 2016-02-12 NOTE — ED Provider Notes (Signed)
[facility] Emergency Department Provider Note   ____________________________________________   First MD Initiated Contact with Patient 02/12/16 [phone]     (approximate)  I have reviewed the triage vital signs and the nursing notes.   HISTORY  Chief Complaint Fall    HPI Frederick Mendoza is a 41 y.o. male is brought to the emergency room today with complaint of anterior chest and abdominal pain after falling this morning. Patient states that he was carrying the trash out when he fell going out the back door.Patient states that he fell down approximately 3 steps landing on the garbage. He states he did not hit his head and there was no loss of consciousness. Patient fell forward with the garbage bag breaking his fall. Patient was able to get up on his own without any difficulty. He has not taken any over-the-counter medication since this happened. He denies any nausea or vomiting. He denies any visual changes. He is concerned that he may have woken a rib. He also complains of abdominal pain and is concerned because he does not see any bruises. Currently he rates his pain as a 3/10. Patient was able to drive himself to the emergency room and has continued to be ambulatory since the accident. Movement does increase his pain.   Past Medical History:  Diagnosis Date  . Bursitis    left shoulder  . Pneumothorax     There are no active problems to display for this patient.   History reviewed. No pertinent surgical history.  Prior to Admission medications   Medication Sig Start Date End Date Taking? Authorizing Provider  traMADol (ULTRAM) 50 MG tablet Take 1 tablet (50 mg total) by mouth every 4 (four) hours as needed. 02/12/16   Tommi Rumps, PA-C    Allergies Penicillins; Other; and Toradol [ketorolac tromethamine]  No family history on file.  Social History Social History  Substance Use Topics  . Smoking status: Current Every Day Smoker    Packs/day:  0.00  . Smokeless tobacco: Current User  . Alcohol use Yes    Review of Systems Constitutional: No fever/chills Eyes: No visual changes. ENT: No trauma Cardiovascular: Denies chest pain. Respiratory: Denies shortness of breath. Positive anterior chest pain. Gastrointestinal: Positive abdominal pain.  No nausea, no vomiting.   Genitourinary: Negative for dysuria. Musculoskeletal: Negative for back pain. Negative for extremity pain. Skin: Positive for superficial abrasions. Neurological: Negative for headaches, focal weakness or numbness.  10-point ROS otherwise negative.  ____________________________________________   PHYSICAL EXAM:  VITAL SIGNS: ED Triage Vitals [02/12/16 0724]  Enc Vitals Group     BP (!) 140/92     Pulse Rate 65     Resp 20     Temp 97.9 F (36.6 C)     Temp Source Oral     SpO2 100 %     Weight 165 lb (74.8 kg)     Height 5\' 11"  (1.803 m)     Head Circumference      Peak Flow      Pain Score 8     Pain Loc      Pain Edu?      Excl. in GC?     Constitutional: Alert and oriented. Well appearing and in no acute distress. Eyes: Conjunctivae are normal. PERRL. EOMI. Head: Atraumatic. Nose: No abrasions or evidence of trauma. Mouth/Throat: Mucous membranes are moist.  Oropharynx non-erythematous. No dental injuries. Neck: No stridor.  No cervical tenderness on palpation posteriorly. Cardiovascular:  Normal rate, regular rhythm. Grossly normal heart sounds.  Good peripheral circulation. Respiratory: Normal respiratory effort.  No retractions. Lungs CTAB. Gastrointestinal: Soft. No distention. No abdominal bruising is noted. There is some diffuse tenderness on palpation. Bowel sounds are normoactive 4 quadrants. Musculoskeletal: On examination of the back there is no gross deformity noted. Range of motion all extremities is within normal limits and no restrictions. Patient was ambulatory without assistance. Neurologic:  Normal speech and language. No  gross focal neurologic deficits are appreciated. No gait instability. Skin:  Skin is warm, dry and intact. Superficial abrasions were noted to the knees without any active bleeding. No ecchymosis or abrasions were seen on the torso Psychiatric: Mood and affect are normal. Speech and behavior are normal.  ____________________________________________   LABS (all labs ordered are listed, but only abnormal results are displayed)  Labs Reviewed  URINALYSIS, COMPLETE (UACMP) WITH MICROSCOPIC - Abnormal; Notable for the following:       Result Value   Color, Urine STRAW (*)    APPearance CLEAR (*)    All other components within normal limits    RADIOLOGY  Acute abdomen with chest x-ray per radiologist: IMPRESSION:  No plain film evidence of acute abnormality. Please see above.    Chronic lung changes without pneumothorax or evidence of rib fracture. I, Tommi Rumpshonda L Lamyra Malcolm, personally viewed and evaluated these images (plain radiographs) as part of my medical decision making, as well as reviewing the written report by the radiologist.  ____________________________________________   PROCEDURES  Procedure(s) performed: None  Procedures  Critical Care performed: No  ____________________________________________   INITIAL IMPRESSION / ASSESSMENT AND PLAN / ED COURSE  Pertinent labs & imaging results that were available during my care of the patient were reviewed by me and considered in my medical decision making (see chart for details).  No evidence of rib fracture was seen per radiologist. Patient was reassured. Patient was given a prescription for tramadol 1 every 4 hours as needed for pain. He [facility] if any continued problems.   ____________________________________________   FINAL CLINICAL IMPRESSION(S) / ED DIAGNOSES  Final diagnoses:  Contusion of chest wall, unspecified laterality, initial encounter  Contusion of abdominal wall,  initial encounter  Fall, initial encounter      NEW MEDICATIONS STARTED DURING THIS VISIT:  Discharge Medication List as of 02/12/2016  9:16 AM       Note:  This document was prepared using Dragon voice recognition software and may include unintentional dictation errors.    Tommi Rumpshonda L Glennis Borger, PA-C 02/12/16 1438    Emily FilbertJonathan E Williams, MD 02/12/16 [phone]

## 2016-02-12 NOTE — Discharge Instructions (Signed)
Follow[facility] if any continued problems. Begin taking tramadol as needed for pain. You may also take Tylenol along with this medication if needed for pain.

## 2016-02-12 NOTE — ED Triage Notes (Signed)
Larey SeatFell out back door this am, fell down 3 stairs, states pain all over. Landed flat prone. No facial injury.

## 2016-02-12 NOTE — ED Notes (Signed)
See triage note  States he missed a step this am and fell face first approx 4 ft  Having pain across chest and mid abd   Also some abrasion to knees

## 2016-04-12 ENCOUNTER — Encounter: Payer: Self-pay | Admitting: Gynecology

## 2016-04-12 ENCOUNTER — Ambulatory Visit
Admission: EM | Admit: 2016-04-12 | Discharge: 2016-04-12 | Disposition: A | Discharge status: Home / Self Care | Payer: 59 | Attending: Emergency Medicine | Admitting: Emergency Medicine

## 2016-04-12 DIAGNOSIS — K047 Periapical abscess without sinus: Secondary | ICD-10-CM | POA: Diagnosis not present

## 2016-04-12 DIAGNOSIS — K0889 Other specified disorders of teeth and supporting structures: Secondary | ICD-10-CM | POA: Diagnosis not present

## 2016-04-12 MED ORDER — HYDROCODONE-ACETAMINOPHEN 5-325 MG PO TABS: ORAL_TABLET | ORAL | 0 refills | Status: DC

## 2016-04-12 MED ORDER — CLINDAMYCIN HCL 300 MG PO CAPS: 300.0000 mg | ORAL_CAPSULE | Freq: Four times a day (QID) | ORAL | 0 refills | Status: DC

## 2016-04-12 NOTE — ED Triage Notes (Signed)
Per patient left upper jaw toothache x couple days.

## 2016-04-12 NOTE — Discharge Instructions (Signed)
°  1 g of Tylenol 4 times a day. Or take 1-2 tabs of the Norco. Each tablet of Norco has 325 mg Tylenol in it.  do not take both Tylenol and Norco. Do not exceed 4 g of Tylenol from all sources in one day.   Look up the [facility] Society's Missions of [facility] for free dental clinics. https://[email or web address]-garcia.biz/.asp  Get there early and be prepared to wait. [employer]ile and [employer] have Armed forces operational officer schools that provide low cost routine dental care. [employer] has a Dealer school.    Other resources: [facility] [address] [place], [place] [phone]  Patients with Medicaid: [facility] [phone]                                [phone] Phone:  [phone]                                                  Phone:  [phone]  Dr. Renee Rival [address] [phone]  If unable to pay or uninsured, contact:  [facility] or [facility] to become qualified for the adult dental clinic.  Sometimes the most cost effective treatment is removal of the tooth.  This can be done very inexpensively through one of the low cost Runner, broadcasting/film/video such as the facility on [facility] in [place] [phone]).  The downside to this is that you will have one less tooth and this can effect your ability to chew.  Some other things that can be done for a dental infection include the following:  Rinse your mouth out with hot salt water (1/2 tsp of table salt and a pinch of baking soda in 8 oz of hot water).  You can do this every 2 or 3 hours. Avoid cold foods, beverages, and cold air.  This will make your symptoms worse. Sleep with your head elevated.  Sleeping flat will cause your gums and oral tissues to swell and make them hurt more.  You can sleep on several pillows.  Even better is to sleep in a recliner with your head higher than your heart. For mild to  moderate pain, you can take Tylenol, ibuprofen, or Aleve. External application of heat by a heating pad, hot water bottle, or hot wet towel can help with pain and speed healing.  You can do this every 2 to 3 hours. Do not fall asleep on a heating pad since this can cause a burn.    Also follow up with one of these primary care providers: Here is a list of primary care providers who are taking new patients:  Dr. Elizabeth Sauer [address] [place] [address] [phone]  Dr. Everlene Other [address]  [address]  [place] [address]  [phone]  [facility] [place] [address]  [place] [address]  [phone]

## 2016-04-12 NOTE — ED Provider Notes (Signed)
HPI  SUBJECTIVE:  Frederick Mendoza is a 41 y.o. male who presents with upper left wisdom tooth dental pain for the past 3 days. States is constant, throbbing, sore, sharp with radiation to his ear, jaw and behind his eye. He reports gingival and left facial swelling. Reports painful swollen left sided cervical lymphadenopathy. States that he has left-sided pain with swallowing but denies sore throat. He states that this tooth has been broken for a month but denies new trauma. He has tried Orajel, demtax,  Listerine salt water rinses without improvement in his symptoms. Symptoms are worse with exposure to air, cold, bending forward or opening his mouth. He has not tried any Tylenol ibuprofen. He denies fevers, sensation of his throat swelling shut, trismus. No antipyretic in the past 6-8 hours. He has a past medical history of essential tremors and pneumothorax per chart review. No history of diabetes, HIV, hypertension. Pt is a smoker.  PMD: None. Dentist: None.  Past Medical History:  Diagnosis Date  . Bursitis    left shoulder  . Pneumothorax     History reviewed. No pertinent surgical history.  No family history on file.  Social History  Substance Use Topics  . Smoking status: Current Every Day Smoker    Packs/day: 0.00  . Smokeless tobacco: Current User  . Alcohol use Yes    No current facility-administered medications for this encounter.   Current Outpatient Prescriptions:  .  clindamycin (CLEOCIN) 300 MG capsule, Take 1 capsule (300 mg total) by mouth 4 (four) times daily. X 10 days, Disp: 40 capsule, Rfl: 0 .  HYDROcodone-acetaminophen (NORCO/VICODIN) 5-325 MG tablet, 1-2 tabs q 6hr prn pain, Disp: 20 tablet, Rfl: 0  Allergies  Allergen Reactions  . Penicillins Anaphylaxis  . Other     steriods possible diaphoresis, vasovagal event  . Toradol [Ketorolac Tromethamine]     Possible diaphoresis, vasovagal      ROS  As noted in HPI.   Physical Exam  BP (!) 144/93 (BP  Location: Left Arm)   Pulse 92   Temp 98.6 F (37 C) (Oral)   Resp 16   Ht  (1.803 m)   Wt 165 lb (74.8 kg)   SpO2 98%   BMI 23.01 kg/m   Constitutional: Well developed, well nourished,Appears to be in a moderate amount of discomfort Eyes: PERRL, EOMI, conjunctiva normal bilaterally HENT: Normocephalic, atraumatic,mucus membranes moist. Tooth #16/3rd molar/wisdom tooth missing, tender to palpation. Positive gingival swelling. Erythematous and tender soft palate left side. No swelling. Uvula midline. Pain with opening his mouth fully, but no trismus. Positive mild swelling left face. No swelling under tongue or inferior to the jaw.  espiratory: Normal inspiratory effort Cardiovascular: Normal rate GI: Nondistended guarding skin: No rash, skin intact Musculoskeletal: Moving all extremities equally Neurologic: Alert & oriented x 3, CN II-XII grossly intact, no motor deficits, sensation grossly intact Neck: Positive left side tender cervical lymphadenopathy  Psychiatric: Speech and behavior appropriate   ED Course   Medications - No data to display  No orders of the defined types were placed in this encounter.  No results found for this or any previous visit (from the past 24 hour(s)). No results found.  ED Clinical Impression  Dental infection  Pain, dental  ED Assessment/Plan  [facility] narcotic database reviewed. Two short prescriptions of Percocet 5/325 from the same prescriber, last one in February 2018 #12.  Presentation most consistent with a dental infection. There does not appear to be any  abscess at this point in time. Patient declined a dental block. We'll send home with clindamycin and Norco. Patient states he is unable tolerate any NSAID whatsoever because it "tears up my stomach".  We'll send home with 3 day course of Norco. He is to continue listerine and warm salt water rinses. Providing dental referral in [place].  he will follow-up with a  dentist of his choice in several days.  Also a local primary care referral . Discussed MDM, plan and followup with patient. Discussed sn/sx that should prompt return to the ED. Patient agrees with plan.   Meds ordered this encounter  Medications  . HYDROcodone-acetaminophen (NORCO/VICODIN) 5-325 MG tablet    Sig: 1-2 tabs q 6hr prn pain    Dispense:  20 tablet    Refill:  0  . clindamycin (CLEOCIN) 300 MG capsule    Sig: Take 1 capsule (300 mg total) by mouth 4 (four) times daily. X 10 days    Dispense:  40 capsule    Refill:  0    *This clinic note was created using Scientist, clinical (histocompatibility and immunogenetics). Therefore, there may be occasional mistakes despite careful proofreading.  ?   Domenick Gong, MD 04/12/16 1721

## 2016-05-23 ENCOUNTER — Encounter: Payer: Self-pay | Admitting: Emergency Medicine

## 2016-05-23 ENCOUNTER — Emergency Department: Payer: 59

## 2016-05-23 ENCOUNTER — Emergency Department
Admission: EM | Admit: 2016-05-23 | Discharge: 2016-05-23 | Disposition: A | Discharge status: Home / Self Care | Payer: 59 | Attending: Emergency Medicine | Admitting: Emergency Medicine

## 2016-05-23 DIAGNOSIS — F17200 Nicotine dependence, unspecified, uncomplicated: Secondary | ICD-10-CM | POA: Insufficient documentation

## 2016-05-23 DIAGNOSIS — M25561 Pain in right knee: Secondary | ICD-10-CM

## 2016-05-23 MED ORDER — HYDROCODONE-ACETAMINOPHEN 5-325 MG PO TABS
1.0000 | ORAL_TABLET | Freq: Once | ORAL | Status: AC
  Administered 2016-05-23: 1 via ORAL
  Filled 2016-05-23: qty 1

## 2016-05-23 MED ORDER — IBUPROFEN 600 MG PO TABS: 600.0000 mg | ORAL_TABLET | Freq: Three times a day (TID) | ORAL | 0 refills | Status: DC | PRN

## 2016-05-23 MED ORDER — HYDROCODONE-ACETAMINOPHEN 5-325 MG PO TABS: ORAL_TABLET | ORAL | 0 refills | Status: DC

## 2016-05-23 MED ORDER — IBUPROFEN 600 MG PO TABS
600.0000 mg | ORAL_TABLET | Freq: Once | ORAL | Status: AC
  Administered 2016-05-23: 600 mg via ORAL
  Filled 2016-05-23: qty 1

## 2016-05-23 NOTE — Discharge Instructions (Signed)
Ice and use Ace wrap for support. Begin taking ibuprofen 3 times a day with food. Take Norco every 4-6 hours if needed for severe pain only while you're at home or at bedtime. You cannot drive while taking this medication.  [facility] or make an appointment at the [facility] that you were seeing [facility].

## 2016-05-23 NOTE — ED Triage Notes (Signed)
States he developed pain to right knee yesterday  Larey SeatFell some pop and then developed some swelling  Unable to bear wt

## 2016-05-23 NOTE — ED Provider Notes (Signed)
[facility] Emergency Department Provider Note ____________________________________________  Time seen: 8:13  I have reviewed the triage vital signs and the nursing notes.  HISTORY  Chief Complaint  Knee Pain   HPI Frederick Mendoza is a 41 y.o. male is complaining of right knee pain. 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 Yesterday the knee pain became much more painful and he felt a "pop" and then developed some swelling. He denies any previous injury to his knee. Patient took ibuprofen 200 mg without any relief. Patient states he was unable to bear weight morning and did not go to work. Currently he rates his pain as a 6 out of 10.  Past Medical History:  Diagnosis Date  . Bursitis    left shoulder  . Pneumothorax     There are no active problems to display for this patient.   History reviewed. No pertinent surgical history.  Prior to Admission medications   Medication Sig Start Date End Date Taking? Authorizing Provider  HYDROcodone-acetaminophen (NORCO/VICODIN) 5-325 MG tablet 1 tablet every 4-6 hours prn pain 05/23/16   Bridget HartshornSummers, Rhonda L, PA-C  ibuprofen (ADVIL,MOTRIN) 600 MG tablet Take 1 tablet (600 mg total) by mouth every 8 (eight) hours as needed. 05/23/16   Tommi RumpsSummers, Rhonda L, PA-C    Allergies Penicillins; Other; and Toradol [ketorolac tromethamine]  No family history on file.  Social History Social History  Substance Use Topics  . Smoking status: Current Every Day Smoker    Packs/day: 0.00  . Smokeless tobacco: Current User  . Alcohol use Yes    Review of Systems  Constitutional: Negative for fever. Cardiovascular: Negative for chest pain. Respiratory: Negative for shortness of breath. Musculoskeletal: Positive for right knee pain. Skin: Negative for rash. Neurological: Negative for  focal weakness or  numbness. ____________________________________________  PHYSICAL EXAM:  VITAL SIGNS: ED Triage Vitals  Enc Vitals Group     BP 05/23/16 0804 (!) 131/92     Pulse Rate 05/23/16 0804 79     Resp 05/23/16 0804 18     Temp 05/23/16 0804 98.2 F (36.8 C)     Temp Source 05/23/16 0804 Oral     SpO2 05/23/16 0804 98 %     Weight 05/23/16 0804 170 lb (77.1 kg)     Height 05/23/16 0804 5\' 11"  (1.803 m)     Head Circumference --      Peak Flow --      Pain Score 05/23/16 0805 6     Pain Loc --      Pain Edu? --      Excl. in GC? --     Constitutional: Alert and oriented. Well appearing and in no distress. Head: Normocephalic and atraumatic. Cardiovascular: Normal rate, regular rhythm. Normal distal pulses. Respiratory: Normal respiratory effort. No wheezes/rales/rhonchi. Gastrointestinal: Soft and nontender. No distention. Musculoskeletal: On examination of the right knee there is no gross deformity noted. There is tenderness on palpation of the medial aspect with some soft tissue tenderness. No erythema or warmth is noted. There is minimal crepitus with range of motion. Ligaments are stable bilaterally but patient isn't a great deal of discomfort while doing this exam. Limping gait was noted. Neurologic:   Normal speech and language. No gross focal neurologic deficits are appreciated. Skin:  Skin is warm, dry and intact. No erythema, ecchymosis or abrasions were noted. Psychiatric: Mood and affect are normal. Patient  exhibits appropriate insight and judgment. ____________________________________________   RADIOLOGY Right knee x-ray per radiologist is negative for acute abnormality. I, Tommi Rumps, personally viewed and evaluated these images (plain radiographs) as part of my medical decision making, as well as reviewing the written report by the radiologist. ____________________________________________  INITIAL IMPRESSION / ASSESSMENT AND PLAN / ED COURSE  Patient is here with  right knee pain. Patient was given Norco and ibuprofen to follow up in the emergency room and had some improvement of his pain. Patient was given a prescription for ibuprofen 600 mg 3 times a day with food and Norco one every 4-6 hours as needed for severe pain. He is also told he could take this only at night as this could cause drowsiness and increase his risk for injury and that he should not drive while taking this medication. Ice and elevation. He is to follow-up with someone at the clinic affiliated with the [facility] in [place] and that he has seen in the past.    ____________________________________________  FINAL CLINICAL IMPRESSION(S) / ED DIAGNOSES  Final diagnoses:  Acute pain of right knee     Tommi Rumps, PA-C 05/23/16 1002    Merrily Brittle, MD 05/23/16 1247

## 2016-08-30 ENCOUNTER — Encounter: Payer: Self-pay | Admitting: Emergency Medicine

## 2016-08-30 ENCOUNTER — Emergency Department
Admission: EM | Admit: 2016-08-30 | Discharge: 2016-08-31 | Disposition: A | Discharge status: Home / Self Care | Payer: 59 | Attending: Emergency Medicine | Admitting: Emergency Medicine

## 2016-08-30 DIAGNOSIS — Y999 Unspecified external cause status: Secondary | ICD-10-CM | POA: Insufficient documentation

## 2016-08-30 DIAGNOSIS — W458XXA Other foreign body or object entering through skin, initial encounter: Secondary | ICD-10-CM | POA: Insufficient documentation

## 2016-08-30 DIAGNOSIS — Z23 Encounter for immunization: Secondary | ICD-10-CM | POA: Insufficient documentation

## 2016-08-30 DIAGNOSIS — Y929 Unspecified place or not applicable: Secondary | ICD-10-CM | POA: Diagnosis not present

## 2016-08-30 DIAGNOSIS — F17210 Nicotine dependence, cigarettes, uncomplicated: Secondary | ICD-10-CM | POA: Diagnosis not present

## 2016-08-30 DIAGNOSIS — S3094XA Unspecified superficial injury of scrotum and testes, initial encounter: Secondary | ICD-10-CM | POA: Diagnosis present

## 2016-08-30 DIAGNOSIS — S3131XA Laceration without foreign body of scrotum and testes, initial encounter: Secondary | ICD-10-CM | POA: Diagnosis not present

## 2016-08-30 DIAGNOSIS — Y939 Activity, unspecified: Secondary | ICD-10-CM | POA: Diagnosis not present

## 2016-08-30 MED ORDER — TETANUS-DIPHTH-ACELL PERTUSSIS 5-2.5-18.5 LF-MCG/0.5 IM SUSP
0.5000 mL | Freq: Once | INTRAMUSCULAR | Status: AC
  Administered 2016-08-30: 0.5 mL via INTRAMUSCULAR
  Filled 2016-08-30: qty 0.5

## 2016-08-30 MED ORDER — HYDROMORPHONE HCL 1 MG/ML IJ SOLN
1.0000 mg | INTRAMUSCULAR | Status: AC
  Administered 2016-08-30: 1 mg via INTRAVENOUS
  Filled 2016-08-30: qty 1

## 2016-08-30 MED ORDER — LIDOCAINE HCL (PF) 1 % IJ SOLN
5.0000 mL | Freq: Once | INTRAMUSCULAR | Status: AC
  Administered 2016-08-30: 5 mL
  Filled 2016-08-30: qty 5

## 2016-08-30 MED ORDER — CLINDAMYCIN HCL 150 MG PO CAPS: 450.0000 mg | ORAL_CAPSULE | Freq: Three times a day (TID) | ORAL | 0 refills | Status: AC

## 2016-08-30 MED ORDER — CLINDAMYCIN PHOSPHATE 600 MG/50ML IV SOLN
600.0000 mg | Freq: Once | INTRAVENOUS | Status: AC
  Administered 2016-08-30: 600 mg via INTRAVENOUS
  Filled 2016-08-30: qty 50

## 2016-08-30 MED ORDER — HYDROCODONE-ACETAMINOPHEN 5-325 MG PO TABS: 1.0000 | ORAL_TABLET | ORAL | 0 refills | Status: DC | PRN

## 2016-08-30 NOTE — ED Notes (Signed)
Called pharmacy to send clindamycin

## 2016-08-30 NOTE — Discharge Instructions (Signed)
You have been seen in the Emergency Department (ED) today for a laceration (cut) to your scrotum.  Please keep the cut clean but do not submerge it in the water.  It has been repaired with sutures that will need to be removed in about 7 days. Please follow up with your doctor, an urgent care, or a urology specialist (like Dr. Apolinar Junes or one of her colleagues) to have the sutures removed.  Please take Tylenol (acetaminophen) or Motrin (ibuprofen) as needed for discomfort as written on the box.   Take Norco as prescribed for severe pain. Do not drink alcohol, drive or participate in any other potentially dangerous activities while taking this medication as it may make you sleepy. Do not take this medication with any other sedating medications, either prescription or over-the-counter. If you were prescribed Percocet or Vicodin, do not take these with acetaminophen (Tylenol) as it is already contained within these medications.   This medication is an opiate (or narcotic) pain medication and can be habit forming.  Use it as little as possible to achieve adequate pain control.  Do not use or use it with extreme caution if you have a history of opiate abuse or dependence.  If you are on a pain contract with your primary care doctor or a pain specialist, be sure to let them know you were prescribed this medication today from the [facility] Emergency Department.  This medication is intended for your use only - do not give any to anyone else and keep it in a secure place where nobody else, especially children, have access to it.  It will also cause or worsen constipation, so you may want to consider taking an over-the-counter stool softener while you are taking this medication.  Take the full course of antibiotics prescribed (three times daily for 1 week).    Please follow up with your doctor as soon as possible regarding today's emergent visit.   Return to the ED or call your doctor if you notice any signs  of infection such as fever, increased pain, increased redness, pus, or other symptoms that concern you.

## 2016-08-30 NOTE — ED Notes (Signed)
Dr. Forbach at bedside.  

## 2016-08-30 NOTE — ED Triage Notes (Signed)
Pt to ED via EMS from home c/o laceration to testicles.  Per EMS patient leaning against railing that gave away.  EMS states 1.5in laceration.  Patient states had tetanus shot 2-3 years ago.

## 2016-08-30 NOTE — ED Notes (Signed)
MD at bedside in procedure.

## 2016-08-30 NOTE — ED Provider Notes (Signed)
[facility] Emergency Department Provider Note  ____________________________________________   First MD Initiated Contact with Patient 08/30/16 2144     (approximate)  I have reviewed the triage vital signs and the nursing notes.   HISTORY  Chief Complaint Laceration    HPI Frederick Mendoza is a 41 y.o. male who presents by EMS for evaluation of acute laceration to scrotum.  He was leaning against an iron railing which gave way and stabbed him in the scrotum.  He saw that it was bleeding and was concerned that the wound was very deep into his scrotum and testicles. EMS was called and the bleeding was well-controlled when they arrived. He reports severe pain but is also very anxious about the wound. He states that the piece of fence was very dirty and rusty. He thinks his last tetanus shot was about three years ago but he is not certain and states that he wants another one because of the dirtiness of the piece of fence. He denies any swelling in the area. He denies any other wounds. The onset was acute, the pain severe, and movement makes the pain worse.   Past Medical History:  Diagnosis Date  . Bursitis    left shoulder  . Pneumothorax     There are no active problems to display for this patient.   History reviewed. No pertinent surgical history.  Prior to Admission medications   Medication Sig Start Date End Date Taking? Authorizing Provider  clindamycin (CLEOCIN) 150 MG capsule Take 3 capsules (450 mg total) by mouth 3 (three) times daily. 08/30/16 09/06/16  Loleta Rose, MD  HYDROcodone-acetaminophen (NORCO/VICODIN) 5-325 MG tablet Take 1-2 tablets by mouth every 4 (four) hours as needed for moderate pain. 08/30/16   Loleta Rose, MD  ibuprofen (ADVIL,MOTRIN) 600 MG tablet Take 1 tablet (600 mg total) by mouth every 8 (eight) hours as needed. 05/23/16   Tommi Rumps, PA-C    Allergies Penicillins; Other; and Toradol [ketorolac  tromethamine]  History reviewed. No pertinent family history.  Social History Social History  Substance Use Topics  . Smoking status: Current Every Day Smoker    Packs/day: 1.00    Types: Cigarettes  . Smokeless tobacco: Former Neurosurgeon  . Alcohol use Yes    Review of Systems Constitutional: No fever/chills Cardiovascular: Denies chest pain. Respiratory: Denies shortness of breath. Gastrointestinal: No abdominal pain.  No nausea, no vomiting.  No diarrhea.  No constipation. Genitourinary: laceration to the scrotum. Musculoskeletal: Negative for neck pain.  Negative for back pain. Integumentary: laceration to the scrotum.  Negative for rash. Neurological: Negative for headaches, focal weakness or numbness.   ____________________________________________   PHYSICAL EXAM:  VITAL SIGNS: ED Triage Vitals  Enc Vitals Group     BP 08/30/16 2140 (!) 140/105     Pulse Rate 08/30/16 2318 76     Resp 08/30/16 2318 18     Temp 08/30/16 2140 98 F (36.7 C)     Temp Source 08/30/16 2140 Oral     SpO2 08/30/16 2318 97 %     Weight 08/30/16 2143 74.8 kg (165 lb)     Height 08/30/16 2143 1.803 m (5\' 11" )     Head Circumference --      Peak Flow --      Pain Score 08/30/16 2142 9     Pain Loc --      Pain Edu? --      Excl. in GC? --  Constitutional: Alert and oriented. anxious but generally Well appearing and in no acute distress. Eyes: Conjunctivae are normal.  Head: Atraumatic. Neck: No stridor.  No meningeal signs.  No cervical spine tenderness to palpation. Cardiovascular: Normal rate, regular rhythm. Good peripheral circulation.  Respiratory: Normal respiratory effort.  No retractions.  Genitourinary: the patient has no evidence of traumatic injury to his penis. His testicles appear normal and are nontender with no obvious swelling to the testicles or scrotum. He has a upside down V-shaped laceration to the central middle anterior portion of his scrotum.  the laceration  appears confined to the skin. The shiny layer of fascia appears to be intact. Although a small defect is possible, I do not appreciate a wound that extends through the fascial layer. there is no testicular involvement that I can appreciate. See procedure note for more details. Musculoskeletal: No lower extremity tenderness nor edema. No gross deformities of extremities. Neurologic:  Normal speech and language. No gross focal neurologic deficits are appreciated.  Skin:  Skin is warm, dry and intact except as described above. No rash noted.  ____________________________________________   LABS (all labs ordered are listed, but only abnormal results are displayed)  Labs Reviewed - No data to display ____________________________________________  EKG  no EKG was ordered ____________________________________________  RADIOLOGY   No results found.  ____________________________________________   PROCEDURES  Critical Care performed: No   Procedure(s) performed:   Marland KitchenMarland KitchenLaceration Repair Date/Time: 08/31/2016 2:16 AM Performed by: Loleta Rose Authorized by: Loleta Rose   Consent:    Consent obtained:  Verbal   Consent given by:  Patient   Risks discussed:  Infection, pain, retained foreign body, poor cosmetic result and poor wound healing Anesthesia (see MAR for exact dosages):    Anesthesia method:  Local infiltration   Local anesthetic:  Lidocaine 1% w/o epi Laceration details:    Location:  Anogenital   Anogenital location:  Scrotum   Length (cm):  3 (V-shaped laceration, 1.5-cm on each edge of the V) Repair type:    Repair type:  Simple Exploration:    Hemostasis achieved with:  Direct pressure   Wound exploration: entire depth of wound probed and visualized     Wound extent: foreign bodies/material     Wound extent: no fascia violation noted     Contaminated: yes   Treatment:    Area cleansed with:  Saline   Amount of cleaning:  Extensive   Irrigation solution:   Sterile saline   Irrigation volume:  400 mL   Irrigation method:  Pressure wash   Visualized foreign bodies/material removed: yes   Skin repair:    Repair method:  Sutures   Suture size:  5-0   Wound skin closure material used: Ethilon.   Suture technique:  Simple interrupted   Number of sutures:  4 Approximation:    Approximation:  Close   Vermilion border: well-aligned   Post-procedure details:    Dressing:  Sterile dressing   Patient tolerance of procedure:  Tolerated well, no immediate complications     ____________________________________________   INITIAL IMPRESSION / ASSESSMENT AND PLAN / ED COURSE  Pertinent labs & imaging results that were available during my care of the patient were reviewed by me and considered in my medical decision making (see chart for details).  there was no evidence of any factual violation or internal injury. After extensive cleaning I close the wound as described above. I have provided a dose of clindamycin 600 mg IV antibiotics for prophylactic  treatment given the contaminated nature of the wound and I have prescribed clindamycin 450 mg three times a day for one week as additional prophylaxis. The choice of that antibiotic was due to his reported anaphylaxis to penicillins. A Tdap was also provided.   I called and spoke with Dr. Annabell Howells with urology to see if you would recommend any additional care or imaging. Based on the information I provided by phone he did not feel any additional imaging, including but not limited to ultrasound, was required at this time. He agreed with my plan for outpatient follow-up in about a week.  I gave the patient wound care instructions both verbally and in written documentation. I checked the [place] controlled substance database and he does not have a concerning or worrisome pattern of narcotic medication administration. Given the nature of the wound I have provided some Norco and I gave him my usual and  customary return precautions.        ____________________________________________  FINAL CLINICAL IMPRESSION(S) / ED DIAGNOSES  Final diagnoses:  Laceration of scrotum, initial encounter     MEDICATIONS GIVEN DURING THIS VISIT:  Medications  HYDROmorphone (DILAUDID) injection 1 mg (1 mg Intravenous Given 08/30/16 2224)  Tdap (BOOSTRIX) injection 0.5 mL (0.5 mLs Intramuscular Given 08/30/16 2226)  lidocaine (PF) (XYLOCAINE) 1 % injection 5 mL (5 mLs Other Given by Other 08/30/16 2258)  clindamycin (CLEOCIN) IVPB 600 mg (0 mg Intravenous Stopped 08/30/16 2356)     NEW OUTPATIENT MEDICATIONS STARTED DURING THIS VISIT:  Discharge Medication List as of 08/30/2016 11:50 PM    START taking these medications   Details  clindamycin (CLEOCIN) 150 MG capsule Take 3 capsules (450 mg total) by mouth 3 (three) times daily., Starting Fri 08/30/2016, Until Fri 09/06/2016, Print        Discharge Medication List as of 08/30/2016 11:50 PM    CONTINUE these medications which have CHANGED   Details  HYDROcodone-acetaminophen (NORCO/VICODIN) 5-325 MG tablet Take 1-2 tablets by mouth every 4 (four) hours as needed for moderate pain., Starting Fri 08/30/2016, Print        Discharge Medication List as of 08/30/2016 11:50 PM       Note:  This document was prepared using Dragon voice recognition software and may include unintentional dictation errors.    Loleta Rose, MD 08/31/16 [phone]

## 2016-09-03 ENCOUNTER — Encounter: Payer: Self-pay | Admitting: Emergency Medicine

## 2016-09-03 ENCOUNTER — Emergency Department
Admission: EM | Admit: 2016-09-03 | Discharge: 2016-09-03 | Disposition: A | Discharge status: Home / Self Care | Payer: 59 | Attending: Emergency Medicine | Admitting: Emergency Medicine

## 2016-09-03 DIAGNOSIS — N5082 Scrotal pain: Secondary | ICD-10-CM | POA: Insufficient documentation

## 2016-09-03 DIAGNOSIS — Z4800 Encounter for change or removal of nonsurgical wound dressing: Secondary | ICD-10-CM | POA: Insufficient documentation

## 2016-09-03 DIAGNOSIS — F17210 Nicotine dependence, cigarettes, uncomplicated: Secondary | ICD-10-CM | POA: Insufficient documentation

## 2016-09-03 DIAGNOSIS — Z5189 Encounter for other specified aftercare: Secondary | ICD-10-CM

## 2016-09-03 MED ORDER — OXYCODONE-ACETAMINOPHEN 5-325 MG PO TABS
1.0000 | ORAL_TABLET | Freq: Once | ORAL | Status: AC
  Administered 2016-09-03: 1 via ORAL
  Filled 2016-09-03: qty 1

## 2016-09-03 MED ORDER — HYDROCODONE-ACETAMINOPHEN 5-325 MG PO TABS: 1.0000 | ORAL_TABLET | Freq: Four times a day (QID) | ORAL | 0 refills | Status: DC | PRN

## 2016-09-03 NOTE — Discharge Instructions (Signed)
Follow-up with your primary care for further pain management and prescriptions for pain management.  Return for suture removal in 5 days, Sept 1 or 2. You may return to your primary care or the emergency department for suture removal.

## 2016-09-03 NOTE — ED Triage Notes (Signed)
Pt to ed with c/o scrotal pain since Saturday night.  Pt states he was seen here for laceration to scrotum.  Pt states since then norco has not helped his pain, reports increased pain with coughing or movement.

## 2016-09-03 NOTE — ED Notes (Addendum)
See triage note  States he was were this past weekend for laceration to scrotum area  Here for recheck  States the area is very painful

## 2016-09-03 NOTE — ED Triage Notes (Signed)
FIRST NURSE NOTE-here recently, needs wound rechecked.

## 2016-09-03 NOTE — ED Provider Notes (Signed)
[facility] Emergency Department Provider Note   ____________________________________________   I have reviewed the triage vital signs and the nursing notes.   HISTORY  Chief Complaint Wound Check    HPI Frederick Mendoza is a 41 y.o. male presents to the emergency department for wound check and pain management. Patient sustained scrotal laceration Saturday evening when an iron rod gave way sustaining the injury. Patient had the wound along the right scrotal area sutured. Patient reported worsening pain, swelling and has come for pain management today. Patient also notes he is scheduled to return to work tomorrow and he feels he is unable to return to work due to current symptoms. Patient reports 10 out of 10 pain at this time. Patient reports compliance with taking antibiotics. Patient denies fever, chills, headache, vision changes, chest pain, chest tightness, shortness of breath, abdominal pain, nausea and vomiting.  Past Medical History:  Diagnosis Date  . Bursitis    left shoulder  . Pneumothorax     There are no active problems to display for this patient.   History reviewed. No pertinent surgical history.  Prior to Admission medications   Medication Sig Start Date End Date Taking? Authorizing Provider  clindamycin (CLEOCIN) 150 MG capsule Take 3 capsules (450 mg total) by mouth 3 (three) times daily. 08/30/16 09/06/16  Loleta Rose, MD  HYDROcodone-acetaminophen (NORCO/VICODIN) 5-325 MG tablet Take 1 tablet by mouth every 6 (six) hours as needed for moderate pain. 09/03/16   Louanne Calvillo M, PA-C  ibuprofen (ADVIL,MOTRIN) 600 MG tablet Take 1 tablet (600 mg total) by mouth every 8 (eight) hours as needed. 05/23/16   Tommi Rumps, PA-C    Allergies Penicillins; Other; and Toradol [ketorolac tromethamine]  History reviewed. No pertinent family history.  Social History Social History  Substance Use Topics  . Smoking status: Current Every Day  Smoker    Packs/day: 1.00    Types: Cigarettes  . Smokeless tobacco: Former Neurosurgeon  . Alcohol use Yes    Review of Systems Constitutional: Negative for fever/chills Eyes: No visual changes. ENT:  Negative for sore throat and for difficulty swallowing Cardiovascular: Denies chest pain. Respiratory: Denies cough. Denies shortness of breath. Gastrointestinal: No abdominal pain.  No nausea, vomiting, diarrhea. Genitourinary: Negative for dysuria. Musculoskeletal: Negative for back pain. Skin: Negative for rash. Healing suture wound with mild erythema and appropriate swelling. Neurological: Negative for headaches.  Negative focal weakness or numbness. Negative for loss of consciousness. Able to ambulate. ____________________________________________   PHYSICAL EXAM:  VITAL SIGNS: ED Triage Vitals  Enc Vitals Group     BP 09/03/16 1142 133/76     Pulse Rate 09/03/16 1142 76     Resp 09/03/16 1142 16     Temp 09/03/16 1142 98.2 F (36.8 C)     Temp Source 09/03/16 1142 Oral     SpO2 09/03/16 1142 100 %     Weight 09/03/16 1143 165 lb (74.8 kg)     Height --      Head Circumference --      Peak Flow --      Pain Score 09/03/16 1142 7     Pain Loc --      Pain Edu? --      Excl. in GC? --     Constitutional: Alert and oriented. Well appearing and in no acute distress.  Eyes: Conjunctivae are normal. PERRL. EOMI  Head: Normocephalic and atraumatic. ENT:      Ears: Canals clear. TMs  intact bilaterally.      Nose: No congestion/rhinnorhea.      Mouth/Throat: Mucous membranes are moist. Neck:Supple. No thyromegaly. No stridor.  Cardiovascular: Normal rate, regular rhythm. Normal S1 and S2.  Good peripheral circulation. Respiratory: Normal respiratory effort without tachypnea or retractions. Lungs CTAB. No wheezes/rales/rhonchi. Good air entry to the bases with no decreased or absent breath sounds. Hematological/Lymphatic/Immunological: No cervical  lymphadenopathy. Cardiovascular: Normal rate, regular rhythm. Normal distal pulses. Gastrointestinal: Bowel sounds 4 quadrants. Soft and nontender to palpation. No guarding or rigidity. No palpable masses. No distention. No CVA tenderness. Musculoskeletal: Nontender with normal range of motion in all extremities. Neurologic: Normal speech and language.  Skin:  Skin is warm, dry and intact. No rash noted.Right scrotal area with healing incision. Sutures intact with mild erythema. No signs of infection noted. Mild ecchymosis appropriate for described injury. Psychiatric: Mood and affect are normal. Speech and behavior are normal. Patient exhibits appropriate insight and judgement.  ____________________________________________   LABS (all labs ordered are listed, but only abnormal results are displayed)  Labs Reviewed - No data to display ____________________________________________  EKG none ____________________________________________  RADIOLOGY none ____________________________________________   PROCEDURES  Procedure(s) performed: no    Critical Care performed: no ____________________________________________   INITIAL IMPRESSION / ASSESSMENT AND PLAN / ED COURSE  Pertinent labs & imaging results that were available during my care of the patient were reviewed by me and considered in my medical decision making (see chart for details).  Patient presents to emergency department with persistent pain along right scrotal injury and concern he is unable to return to his job activities. History and physical exam findings are reassuring the sutures are intact, healing, no signs of developing infection. Erythema, swelling is appropriate for injury and healing at this time. Patient will be prescribed another short course of Vicodin for pain management and will be given a work note. Patient advised to follow up with PCP for future pain management and prescription as well as work notes.  Advised him if symptoms significantly worsened or infection developed do not hesitate to return to the emergency department if symptoms return or worsen. Patient informed of clinical course, understand medical decision-making process, and agree with plan.      If controlled substance prescribed during this visit, 12 month history viewed on the [facility] prior to issuing an initial prescription for Schedule II or III opiod. ____________________________________________   FINAL CLINICAL IMPRESSION(S) / ED DIAGNOSES  Final diagnoses:  Visit for wound check  Scrotal pain       NEW MEDICATIONS STARTED DURING THIS VISIT:  Discharge Medication List as of 09/03/2016  2:28 PM       Note:  This document was prepared using Dragon voice recognition software and may include unintentional dictation errors.    Clois Comber, PA-C 09/03/16 1542    Emily Filbert, MD 09/06/16 [phone]

## 2016-09-08 ENCOUNTER — Emergency Department
Admission: EM | Admit: 2016-09-08 | Discharge: 2016-09-08 | Disposition: A | Discharge status: Home / Self Care | Payer: 59 | Attending: Emergency Medicine | Admitting: Emergency Medicine

## 2016-09-08 ENCOUNTER — Encounter: Payer: Self-pay | Admitting: Emergency Medicine

## 2016-09-08 DIAGNOSIS — X58XXXA Exposure to other specified factors, initial encounter: Secondary | ICD-10-CM | POA: Diagnosis not present

## 2016-09-08 DIAGNOSIS — S3131XD Laceration without foreign body of scrotum and testes, subsequent encounter: Secondary | ICD-10-CM | POA: Insufficient documentation

## 2016-09-08 DIAGNOSIS — F17210 Nicotine dependence, cigarettes, uncomplicated: Secondary | ICD-10-CM | POA: Diagnosis not present

## 2016-09-08 DIAGNOSIS — Z4802 Encounter for removal of sutures: Secondary | ICD-10-CM | POA: Diagnosis not present

## 2016-09-08 NOTE — ED Triage Notes (Signed)
Pt here for stitch removal. Pt had stitches for scrotal laceration 8/24. Pt denies pain at this time. VSS.

## 2016-09-08 NOTE — ED Notes (Signed)
Pt. Has laceration with sutures in half of the laceration. Pt. States he had part of the sutures removed but they were not ready to be removed yet.

## 2016-09-08 NOTE — ED Notes (Signed)
Pt left without discharge paperwork.

## 2016-09-08 NOTE — Discharge Instructions (Signed)
Return to the emergency department for symptoms of concern if you are unable to schedule an appointment with her primary care provider.

## 2016-09-08 NOTE — ED Provider Notes (Signed)
[facility] Emergency Department Provider Note  ____________________________________________  Time seen: 8:45 AM  I have reviewed the triage vital signs and the nursing notes.   HISTORY  Chief Complaint Suture / Staple Removal   HPI Frederick Mendoza is a 41 y.o. male who presents to the emergency department for suture removal. Sutures were inserted into the right scrotum on 08/30/2016 after the railing of an iron fence broke in one of the spindles jammed up into his scrotum. He denies complaints or symptoms of concern today. He has taken all of the antibiotic as prescribed.    Past Medical History:  Diagnosis Date  . Bursitis    left shoulder  . Pneumothorax     There are no active problems to display for this patient.   History reviewed. No pertinent surgical history.  Current Outpatient Rx  . Order #: [id number] Class: Print  . Order #: [id number] Class: Print    Allergies Penicillins; Other; and Toradol [ketorolac tromethamine]  No family history on file.  Social History Social History  Substance Use Topics  . Smoking status: Current Every Day Smoker    Packs/day: 1.00    Types: Cigarettes  . Smokeless tobacco: Former Neurosurgeon  . Alcohol use Yes    Review of Systems  Constitutional: Denies fever.  HEENT: No change from baseline Respiratory: No cough or shortness of breath Musculoskeletal: No pain. Skin: healing wound; pain gradually resolving.  ____________________________________________   PHYSICAL EXAM:  VITAL SIGNS: ED Triage Vitals  Enc Vitals Group     BP 09/08/16 0835 132/81     Pulse Rate 09/08/16 0835 65     Resp 09/08/16 0835 16     Temp 09/08/16 0835 98.3 F (36.8 C)     Temp Source 09/08/16 0835 Oral     SpO2 09/08/16 0835 100 %     Weight 09/08/16 0835 170 lb (77.1 kg)     Height 09/08/16 0835 5\' 11"  (1.803 m)     Head Circumference --      Peak Flow --      Pain Score 09/08/16 0834 0     Pain Loc --      Pain  Edu? --      Excl. in GC? --      Constitutional: Appears well. No distress HEENT: Atraumtaic, normal appearance, EOMI, sclera normal, voice normal. Respiratory: Respirations even and unlabored.  Cardiovascular: Capillary refill normal. Peripheral pulses 2+ Musculoskeletal: Full ROM x 4. Skin: Healing wound noted over the right side of the scrotum with 3 sutures remaining in place. No evidence of infection or cellulitis. Neurovascular: Gait steady; Alert and oriented x 4.   PROCEDURES  Procedure(s) performed: SUTURE REMOVAL Performed by:   Consent: Verbal consent obtained. Patient identity confirmed: provided demographic data Time out: Immediately prior to procedure a "time out" was called to verify the correct patient, procedure, equipment, support staff and site/side marked as required.  Location details: Right scrotum  Wound Appearance: clean  Sutures/Staples Removed: 3 sutures removed by me   Facility: sutures placed in this facility Patient tolerance: Patient tolerated the procedure well with no immediate complications.    ____________________________________________   INITIAL IMPRESSION / ASSESSMENT AND PLAN / ED COURSE  Pertinent labs & imaging results that were available during my care of the patient were reviewed by me and considered in my medical decision making (see chart for details).  Wound care discussed. Patient advised to keep covered with sunscreen. Patient was advised to return to  the ER for symptoms that change or worsen if unable to schedule an appointment with primary care.  ____________________________________________   FINAL CLINICAL IMPRESSION(S) / ED DIAGNOSES  Final diagnoses:  Visit for suture removal      Chinita Pesterriplett, Amaro Mangold B, FNP 09/08/16 [id number]    Phineas SemenGoodman, Graydon, MD 09/08/16 1122

## 2016-12-03 ENCOUNTER — Encounter: Payer: Self-pay | Admitting: Emergency Medicine

## 2016-12-03 ENCOUNTER — Other Ambulatory Visit: Payer: Self-pay

## 2016-12-03 ENCOUNTER — Emergency Department
Admission: EM | Admit: 2016-12-03 | Discharge: 2016-12-03 | Disposition: A | Discharge status: Home / Self Care | Payer: 59 | Attending: Emergency Medicine | Admitting: Emergency Medicine

## 2016-12-03 DIAGNOSIS — F17210 Nicotine dependence, cigarettes, uncomplicated: Secondary | ICD-10-CM | POA: Insufficient documentation

## 2016-12-03 DIAGNOSIS — J069 Acute upper respiratory infection, unspecified: Secondary | ICD-10-CM | POA: Diagnosis not present

## 2016-12-03 DIAGNOSIS — R05 Cough: Secondary | ICD-10-CM | POA: Diagnosis present

## 2016-12-03 MED ORDER — AZITHROMYCIN 250 MG PO TABS: ORAL_TABLET | ORAL | 0 refills | Status: DC

## 2016-12-03 NOTE — Discharge Instructions (Signed)
Follow-up with your regular doctor or the acute care if you are not better in 3-5 days, return to the emergency room if you are worsening, take the medication as prescribed, the Z-Pak he take for 5 days but the antibiotic will last for 10,

## 2016-12-03 NOTE — ED Triage Notes (Signed)
Flu like symptoms, cough,fever and chills.

## 2016-12-03 NOTE — ED Notes (Signed)
See triage note  States he developed nasal congestion and body aches last week  conts to have some body aches and occasional prod cough   Afebrile on arrival

## 2016-12-03 NOTE — ED Provider Notes (Signed)
[facility] Emergency Department Provider Note  ____________________________________________   First MD Initiated Contact with Patient 12/03/16 [phone]     (approximate)  I have reviewed the triage vital signs and the nursing notes.   HISTORY  Chief Complaint Generalized Body Aches    HPI Frederick Mendoza is a 41 y.o. male complains of cough and congestion for the past week, states that over the last 2 days he has developed body aches, fever, chills, states he is feeling much worse, the mucus is brown, green and yellow, he denies chest pain, shortness of breath, vomiting, diarrhea    Past Medical History:  Diagnosis Date  . Bursitis    left shoulder  . Pneumothorax     There are no active problems to display for this patient.   History reviewed. No pertinent surgical history.  Prior to Admission medications   Medication Sig Start Date End Date Taking? Authorizing Provider  azithromycin (ZITHROMAX Z-PAK) 250 MG tablet 2 pills today then 1 pill a day for 4 days 12/03/16   Faythe GheeFisher, Keyandre Pileggi W, PA-C    Allergies Penicillins; Other; and Toradol [ketorolac tromethamine]  No family history on file.  Social History Social History   Tobacco Use  . Smoking status: Current Every Day Smoker    Packs/day: 1.00    Types: Cigarettes  . Smokeless tobacco: Former Engineer, waterUser  Substance Use Topics  . Alcohol use: Yes  . Drug use: No    Review of Systems  Constitutional: Positive fever/chills Eyes: No visual changes. ENT: No sore throat. Positive runny nose and congestion Respiratory: Positive cough Genitourinary: Negative for dysuria. Musculoskeletal: Negative for back pain. Skin: Negative for rash.    ____________________________________________   PHYSICAL EXAM:  VITAL SIGNS: ED Triage Vitals  Enc Vitals Group     BP 12/03/16 0815 (!) 147/93     Pulse Rate 12/03/16 0815 91     Resp 12/03/16 0815 20     Temp 12/03/16 0815 98.2 F (36.8 C)   Temp Source 12/03/16 0815 Oral     SpO2 12/03/16 0815 97 %     Weight 12/03/16 0816 160 lb (72.6 kg)     Height 12/03/16 0816 5\' 11"  (1.803 m)     Head Circumference --      Peak Flow --      Pain Score 12/03/16 0815 7     Pain Loc --      Pain Edu? --      Excl. in GC? --     Constitutional: Alert and oriented. Well appearing and in no acute distress. Eyes: Conjunctivae are normal.  Head: Atraumatic. Nose: No congestion/rhinnorhea. Throat is irritated, no exudate is noted Mouth/Throat: Mucous membranes are moist.   Cardiovascular: Normal rate, regular rhythm. Respiratory: Normal respiratory effort.  No retractions, lungs are clear to auscultation, cough is wet GU: deferred Musculoskeletal: FROM all extremities, warm and well perfused Neurologic:  Normal speech and language.  Skin:  Skin is warm, dry and intact. No rash noted. Psychiatric: Mood and affect are normal. Speech and behavior are normal.  ____________________________________________   LABS (all labs ordered are listed, but only abnormal results are displayed)  Labs Reviewed - No data to display ____________________________________________   ____________________________________________  RADIOLOGY    ____________________________________________   PROCEDURES  Procedure(s) performed: No      ____________________________________________   INITIAL IMPRESSION / ASSESSMENT AND PLAN / ED COURSE  Pertinent labs & imaging results that were available during my care of  the patient were reviewed by me and considered in my medical decision making (see chart for details).  Patient is a 41 year old male that has been sick for a week, states his symptoms have been worsening over the last 2 days, he has developed chills and fever, the cough is wet and his mucus is brown and green, patient appears fairly well, diagnosis is acute upper respiratory infection, due to the length and nature of the illness Z-Pak was  provided, patient was instructed to return to the emergency room or his regular doctor if he is not better in 3-5 days, he is to use over-the-counter cough and cold medications as needed, Tylenol for fever and chills as needed      ____________________________________________   FINAL CLINICAL IMPRESSION(S) / ED DIAGNOSES  Final diagnoses:  Acute URI      NEW MEDICATIONS STARTED DURING THIS VISIT:  This SmartLink is deprecated. Use AVSMEDLIST instead to display the medication list for a patient.   Note:  This document was prepared using Dragon voice recognition software and may include unintentional dictation errors.    Faythe GheeFisher, Nelli Swalley W, PA-C 12/03/16 1117    Jene EveryKinner, Robert, MD 12/03/16 [phone]

## 2017-03-21 DIAGNOSIS — M5416 Radiculopathy, lumbar region: Secondary | ICD-10-CM | POA: Insufficient documentation

## 2017-05-09 ENCOUNTER — Other Ambulatory Visit: Payer: Self-pay

## 2017-05-09 ENCOUNTER — Emergency Department
Admission: EM | Admit: 2017-05-09 | Discharge: 2017-05-09 | Disposition: A | Discharge status: Home / Self Care | Payer: Commercial Managed Care - HMO | Attending: Emergency Medicine | Admitting: Emergency Medicine

## 2017-05-09 DIAGNOSIS — M5430 Sciatica, unspecified side: Secondary | ICD-10-CM

## 2017-05-09 DIAGNOSIS — G8929 Other chronic pain: Secondary | ICD-10-CM | POA: Diagnosis not present

## 2017-05-09 DIAGNOSIS — F17210 Nicotine dependence, cigarettes, uncomplicated: Secondary | ICD-10-CM | POA: Insufficient documentation

## 2017-05-09 DIAGNOSIS — M545 Low back pain: Secondary | ICD-10-CM | POA: Diagnosis present

## 2017-05-09 DIAGNOSIS — M5440 Lumbago with sciatica, unspecified side: Secondary | ICD-10-CM | POA: Diagnosis not present

## 2017-05-09 MED ORDER — METHYLPREDNISOLONE 4 MG PO TBPK: ORAL_TABLET | ORAL | 0 refills | Status: DC

## 2017-05-09 MED ORDER — HYDROCODONE-ACETAMINOPHEN 5-325 MG PO TABS: 1.0000 | ORAL_TABLET | Freq: Four times a day (QID) | ORAL | 0 refills | Status: DC | PRN

## 2017-05-09 MED ORDER — METHYLPREDNISOLONE SODIUM SUCC 125 MG IJ SOLR
125.0000 mg | Freq: Once | INTRAMUSCULAR | Status: AC
  Administered 2017-05-09: 125 mg via INTRAVENOUS
  Filled 2017-05-09: qty 2

## 2017-05-09 MED ORDER — MORPHINE SULFATE (PF) 4 MG/ML IV SOLN
4.0000 mg | Freq: Once | INTRAVENOUS | Status: AC
  Administered 2017-05-09: 4 mg via INTRAVENOUS
  Filled 2017-05-09: qty 1

## 2017-05-09 MED ORDER — BACLOFEN 10 MG PO TABS: 10.0000 mg | ORAL_TABLET | Freq: Every day | ORAL | 1 refills | Status: DC

## 2017-05-09 NOTE — ED Notes (Signed)
First Nurse Note:  Patient states every time he puts weight on his left foot it "feels like a lightning bolt" going down left leg.  Has been present for a long time but worse this AM.

## 2017-05-09 NOTE — ED Notes (Signed)
See triage note  Presents with lower back pain  States pain is mainly to left lower back and radiates into groin and left leg  States he was unable to bent to put shoes on this am  Ambulates with limp   also states his foot is going to sleep on the left

## 2017-05-09 NOTE — ED Triage Notes (Signed)
Pt states hx of sciatica, states has bothered him for a long time, recently pain has gotten worse and radiates down the left leg into the knee and foot with some foot numbness

## 2017-05-09 NOTE — Discharge Instructions (Addendum)
Follow-up with either [facility] or [facility].  Both of these our spine surgeons.  Take your disc so that you may be evaluated via the disc.  Sometimes they like to repeat the MRI but I would show them the disc first.  Use medication as prescribed.  Apply ice to your lower back.

## 2017-05-09 NOTE — ED Notes (Signed)
Iv pain meds given   Side rails up and effects explained  Family at bedside

## 2017-05-09 NOTE — ED Provider Notes (Signed)
[facility] Emergency Department Provider Note  ____________________________________________   None    (approximate)  I have reviewed the triage vital signs and the nursing notes.   HISTORY  Chief Complaint Back Pain and Leg Pain    HPI Frederick Mendoza is a 42 y.o. male resents emergency department complaining of low back pain.  He states he has history of degenerative disc with bulging in the lower back.  He had a MRI at [facility] in March.  He saw a orthopedic at [facility] who tried to adjust his back and he is not happy stating that this made him worse.  He would like to see someone in [place].  He states that the pain from his lower back radiates to the groin and then down to the foot.  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  Past Medical History:  Diagnosis Date  . Bursitis    left shoulder  . Pneumothorax     There are no active problems to display for this patient.   No past surgical history on file.  Prior to Admission medications   Medication Sig Start Date End Date Taking? Authorizing Provider  baclofen (LIORESAL) 10 MG tablet Take 1 tablet (10 mg total) by mouth daily. 05/09/17 05/09/18  Fisher, Roselyn Bering, PA-C  HYDROcodone-acetaminophen (NORCO/VICODIN) 5-325 MG tablet Take 1 tablet by mouth every 6 (six) hours as needed for moderate pain. 05/09/17   Fisher, Roselyn Bering, PA-C  methylPREDNISolone (MEDROL DOSEPAK) 4 MG TBPK tablet Take 6 pills on day one then decrease by 1 pill each day 05/09/17   Faythe Ghee, PA-C    Allergies Penicillins; Other; and Toradol [ketorolac tromethamine]  No family history on file.  Social History Social History   Tobacco Use  . Smoking status: Current Every Day Smoker    Packs/day: 1.00    Types: Cigarettes  . Smokeless tobacco: Former Engineer, water Use Topics  . Alcohol use: Yes  . Drug use: No    Review of Systems  Constitutional: No fever/chills Eyes: No visual changes. ENT: No sore  throat. Respiratory: Denies cough Genitourinary: Negative for dysuria. Musculoskeletal: Positive for back pain. Skin: Negative for rash.    ____________________________________________   PHYSICAL EXAM:  VITAL SIGNS: ED Triage Vitals  Enc Vitals Group     BP 05/09/17 0728 119/81     Pulse Rate 05/09/17 0728 66     Resp 05/09/17 0728 16     Temp 05/09/17 0728 98 F (36.7 C)     Temp Source 05/09/17 0728 Oral     SpO2 05/09/17 0728 98 %     Weight 05/09/17 0729 160 lb (72.6 kg)     Height 05/09/17 0729  (1.803 m)     Head Circumference --      Peak Flow --      Pain Score 05/09/17 0729 7     Pain Loc --      Pain Edu? --      Excl. in GC? --     Constitutional: Alert and oriented. Well appearing and in no acute distress. Eyes: Conjunctivae are normal.  Head: Atraumatic. Nose: No congestion/rhinnorhea. Mouth/Throat: Mucous membranes are moist.   Cardiovascular: Normal rate, regular rhythm. Respiratory: Normal respiratory effort.  No retractions GU: deferred Musculoskeletal: FROM all extremities, warm and well perfused.  Patient walks slowly.  Pain is reproduced with any movement.  The lumbar spine is tender to palpation.  Positive straight leg raise  on the left side.  Patient has some diminished strength in the left great toe when compared to the right.  Patient walks with a limp but no foot drop is noted Neurologic:  Normal speech and language.  Skin:  Skin is warm, dry and intact. No rash noted. Psychiatric: Mood and affect are normal. Speech and behavior are normal.  ____________________________________________   LABS (all labs ordered are listed, but only abnormal results are displayed)  Labs Reviewed - No data to display ____________________________________________   ____________________________________________  RADIOLOGY    ____________________________________________   PROCEDURES  Procedure(s)  performed:No  Procedures    ____________________________________________   INITIAL IMPRESSION / ASSESSMENT AND PLAN / ED COURSE  Pertinent labs & imaging results that were available during my care of the patient were reviewed by me and considered in my medical decision making (see chart for details).  Patient is a 42 year old male presents emergency department with complaints of low back pain.  He has chronic pain due to degenerative disc and bulging.  He was referred to a orthopedic at [facility].  He states the doctor tried to adjust him which he found odd and then he has been worse since then.  He would like to see someone in [place] instead of going back to [place].  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  On physical exam the patient appears quite uncomfortable.  The lumbar spine is tender to palpation.  He has positive straight leg raise.  There is some decreased weakness in the left great toe when compared to the right.  The patient walks with a limp but there is no foot drop noted at this time.  Patient was given a saline lock.  Morphine 4 mg IV, Solu-Medrol 125 mg IV.  Explained to the patient since this is chronic we will refer him to a neurosurgeon for evaluation.  He has a copy of MRI on a disc which he should take to the neurosurgeon.  He will be discharged with steroid pack, pain medication, muscle relaxers.  He is to follow-up with the neurosurgeon.  He is to apply ice to the lower back.  He states he understands.  Patient states he did get relief with the morphine and Solu-Medrol.  He is mostly interested in the referral to a different neurosurgeon.     As part of my medical decision making, I reviewed the following data within the electronic MEDICAL RECORD NUMBER Nursing notes reviewed and incorporated, Old chart reviewed, Notes from prior ED visits and [place] Controlled Substance Database  ____________________________________________   FINAL  CLINICAL IMPRESSION(S) / ED DIAGNOSES  Final diagnoses:  Acute sciatica  Chronic low back pain with sciatica, sciatica laterality unspecified, unspecified back pain laterality      NEW MEDICATIONS STARTED DURING THIS VISIT:  New Prescriptions   BACLOFEN (LIORESAL) 10 MG TABLET    Take 1 tablet (10 mg total) by mouth daily.   HYDROCODONE-ACETAMINOPHEN (NORCO/VICODIN) 5-325 MG TABLET    Take 1 tablet by mouth every 6 (six) hours as needed for moderate pain.   METHYLPREDNISOLONE (MEDROL DOSEPAK) 4 MG TBPK TABLET    Take 6 pills on day one then decrease by 1 pill each day     Note:  This document was prepared using Dragon voice recognition software and may include unintentional dictation errors.    Faythe Ghee, PA-C 05/09/17 0820    Nita Sickle, MD 05/12/17 2016

## 2017-05-23 ENCOUNTER — Other Ambulatory Visit: Payer: Self-pay

## 2017-05-23 ENCOUNTER — Emergency Department
Admission: EM | Admit: 2017-05-23 | Discharge: 2017-05-23 | Disposition: A | Discharge status: Home / Self Care | Payer: Commercial Managed Care - HMO | Attending: Emergency Medicine | Admitting: Emergency Medicine

## 2017-05-23 ENCOUNTER — Emergency Department: Payer: Commercial Managed Care - HMO

## 2017-05-23 DIAGNOSIS — Y9301 Activity, walking, marching and hiking: Secondary | ICD-10-CM | POA: Diagnosis not present

## 2017-05-23 DIAGNOSIS — W108XXA Fall (on) (from) other stairs and steps, initial encounter: Secondary | ICD-10-CM | POA: Insufficient documentation

## 2017-05-23 DIAGNOSIS — F17210 Nicotine dependence, cigarettes, uncomplicated: Secondary | ICD-10-CM | POA: Diagnosis not present

## 2017-05-23 DIAGNOSIS — S0993XA Unspecified injury of face, initial encounter: Secondary | ICD-10-CM | POA: Diagnosis present

## 2017-05-23 DIAGNOSIS — S0083XA Contusion of other part of head, initial encounter: Secondary | ICD-10-CM

## 2017-05-23 DIAGNOSIS — Y999 Unspecified external cause status: Secondary | ICD-10-CM | POA: Insufficient documentation

## 2017-05-23 DIAGNOSIS — W19XXXA Unspecified fall, initial encounter: Secondary | ICD-10-CM

## 2017-05-23 DIAGNOSIS — Y92009 Unspecified place in unspecified non-institutional (private) residence as the place of occurrence of the external cause: Secondary | ICD-10-CM | POA: Diagnosis not present

## 2017-05-23 MED ORDER — HYDROCODONE-ACETAMINOPHEN 5-325 MG PO TABS: 1.0000 | ORAL_TABLET | Freq: Four times a day (QID) | ORAL | 0 refills | Status: DC | PRN

## 2017-05-23 NOTE — Discharge Instructions (Addendum)
Clean area for abrasions daily with mild soap and water.  Watch for any signs of infection.  Place ice packs off and on as needed for swelling.  Follow-up with your primary care clinic at [facility] in [place] or urgent care if any signs of infection.  Norco 1 every 6 hours as needed for moderate pain.  Do not drive or operate machinery while taking this medication.

## 2017-05-23 NOTE — ED Notes (Signed)
Pt verbalized understanding of discharge instructions. NAD at this time. 

## 2017-05-23 NOTE — ED Triage Notes (Addendum)
Pt states last night fell down a few concrete steps, hitting face. abrasions noted to upper lip and chin. Denies blood thinner use. Alert, oriented, ambulatory. Denies loosing any teeth. Denied being dizzy, states just tripped. Denies LOC. Also c/o L knee pain.

## 2017-05-23 NOTE — ED Provider Notes (Signed)
[facility] Emergency Department Provider Note   ____________________________________________   First MD Initiated Contact with Patient 05/23/17 [phone]     (approximate)  I have reviewed the triage vital signs and the nursing notes.   HISTORY  Chief Complaint Fall and Facial Injury   HPI Frederick Mendoza is a 42 y.o. male is here with facial abrasions and complaint of facial pain after falling down concrete steps last evening.  He states his face hit the concrete.  He denies any head injury or loss of consciousness.  He does not take any blood thinners.  He is concerned because he has a history of a fractured palate and saw blood this morning when he was brushing his teeth.  He also has some left knee pain but has continued to ambulate without any difficulty.  This was not a syncopal episode but a mechanical one in which he tripped.  Rates his pain as an 8 out of 10.  Past Medical History:  Diagnosis Date  . Bursitis    left shoulder  . Pneumothorax     There are no active problems to display for this patient.   History reviewed. No pertinent surgical history.  Prior to Admission medications   Medication Sig Start Date End Date Taking? Authorizing Provider  HYDROcodone-acetaminophen (NORCO/VICODIN) 5-325 MG tablet Take 1 tablet by mouth every 6 (six) hours as needed for moderate pain. 05/23/17   Tommi Rumps, PA-C    Allergies Penicillins; Other; and Toradol [ketorolac tromethamine]  History reviewed. No pertinent family history.  Social History Social History   Tobacco Use  . Smoking status: Current Every Day Smoker    Packs/day: 1.00    Types: Cigarettes  . Smokeless tobacco: Former Engineer, water Use Topics  . Alcohol use: Yes  . Drug use: No    Review of Systems Constitutional: No fever/chills Eyes: No visual changes. ENT: Multiple abrasions to upper and lower lip.  Anterior dental tenderness.  Positive bilateral maxillary  tenderness. Cardiovascular: Denies chest pain. Respiratory: Denies shortness of breath. Gastrointestinal:  No nausea, no vomiting.  Musculoskeletal: Positive for left knee pain. Skin: Positive for multiple abrasions. Neurological: Negative for headaches, focal weakness or numbness. ____________________________________________   PHYSICAL EXAM:  VITAL SIGNS: ED Triage Vitals  Enc Vitals Group     BP 05/23/17 0819 129/77     Pulse Rate 05/23/17 0819 66     Resp 05/23/17 0819 16     Temp 05/23/17 0819 98.5 F (36.9 C)     Temp Source 05/23/17 0819 Oral     SpO2 05/23/17 0819 98 %     Weight 05/23/17 0819 150 lb (68 kg)     Height 05/23/17 0819  (1.803 m)     Head Circumference --      Peak Flow --      Pain Score 05/23/17 0818 8     Pain Loc --      Pain Edu? --      Excl. in GC? --    Constitutional: Alert and oriented. Well appearing and in no acute distress. Eyes: Conjunctivae are normal. PERRL. EOMI. Head: Atraumatic. Nose: No congestion/rhinnorhea. Mouth/Throat: Mucous membranes are moist.  Oropharynx non-erythematous.  No active bleeding from the gums is noted.  No dental injury noted acutely.  There is moderate tenderness on palpation of the Neck: No stridor.  Minimal tenderness noted posterior cervical spine to palpation. Cardiovascular: Normal rate, regular rhythm. Grossly normal heart sounds.  Good peripheral circulation. Respiratory: Normal respiratory effort.  No retractions. Lungs CTAB. Gastrointestinal: Soft and nontender. No distention. Musculoskeletal: Examination of left knee there is no gross deformity and no evidence of effusion.  There is no abrasions noted.  Range of motion is without restriction.  Minimal tenderness is noted anteriorly.  Patient is able to ambulate without any assistance and without limp. Neurologic:  Normal speech and language. No gross focal neurologic deficits are appreciated. No gait instability. Skin:  Skin is warm, dry.   Multiple superficial abrasions noted around the mouth without active bleeding. Psychiatric: Mood and affect are normal. Speech and behavior are normal.  ____________________________________________   LABS (all labs ordered are listed, but only abnormal results are displayed)  Labs Reviewed - No data to display  RADIOLOGY  Official radiology report(s): Ct Cervical Spine Wo Contrast  Result Date: 05/23/2017 CLINICAL DATA:  Fall down steps last night with base abrasion. Patient denies losing any teeth. Initial encounter. EXAM: CT MAXILLOFACIAL WITHOUT CONTRAST CT CERVICAL SPINE WITHOUT CONTRAST TECHNIQUE: Multidetector CT imaging of the cervical spine and maxillofacial structures were performed using the standard protocol without intravenous contrast. Multiplanar CT image reconstructions of the cervical spine and maxillofacial structures were also generated. COMPARISON:  None. FINDINGS: CT MAXILLOFACIAL FINDINGS Osseous: No fracture or mandibular dislocation. No destructive process. Missing left upper central incisor that appears chronic. Orbits: No evidence of injury Sinuses: Negative.  No hemosinus. Soft tissues: Upper lip swelling without opaque foreign body. CT CERVICAL SPINE FINDINGS Alignment: No traumatic malalignment Skull base and vertebrae: Negative for fracture Soft tissues and spinal canal: No prevertebral fluid or swelling. No visible canal hematoma. Disc levels: Disc degeneration with mild spurring at C4-5 and C5-6. No visible cord impingement Upper chest: No evidence of injury.  Mild paraseptal emphysema. IMPRESSION: 1. Lip swelling without facial fracture. 2. No evidence of acute cervical spine injury. Electronically Signed   By: Marnee Spring M.D.   On: 05/23/2017 09:27   Ct Maxillofacial Wo Contrast  Result Date: 05/23/2017 CLINICAL DATA:  Fall down steps last night with base abrasion. Patient denies losing any teeth. Initial encounter. EXAM: CT MAXILLOFACIAL WITHOUT CONTRAST CT  CERVICAL SPINE WITHOUT CONTRAST TECHNIQUE: Multidetector CT imaging of the cervical spine and maxillofacial structures were performed using the standard protocol without intravenous contrast. Multiplanar CT image reconstructions of the cervical spine and maxillofacial structures were also generated. COMPARISON:  None. FINDINGS: CT MAXILLOFACIAL FINDINGS Osseous: No fracture or mandibular dislocation. No destructive process. Missing left upper central incisor that appears chronic. Orbits: No evidence of injury Sinuses: Negative.  No hemosinus. Soft tissues: Upper lip swelling without opaque foreign body. CT CERVICAL SPINE FINDINGS Alignment: No traumatic malalignment Skull base and vertebrae: Negative for fracture Soft tissues and spinal canal: No prevertebral fluid or swelling. No visible canal hematoma. Disc levels: Disc degeneration with mild spurring at C4-5 and C5-6. No visible cord impingement Upper chest: No evidence of injury.  Mild paraseptal emphysema. IMPRESSION: 1. Lip swelling without facial fracture. 2. No evidence of acute cervical spine injury. Electronically Signed   By: Marnee Spring M.D.   On: 05/23/2017 09:27    ____________________________________________   PROCEDURES  Procedure(s) performed: None  Procedures  Critical Care performed: No  ____________________________________________   INITIAL IMPRESSION / ASSESSMENT AND PLAN / ED COURSE  As part of my medical decision making, I reviewed the following data within the electronic MEDICAL RECORD NUMBER Notes from prior ED visits and [place] Controlled Substance Database  Patient was given instructions  on how to take care of his multiple abrasions to his face secondary to his fall.  Patient's tetanus was up-to-date.  He was given a prescription for Norco as needed for pain.  He is to follow-up with his PCP in [place] if any signs of infection.  Patient is to continue applying ice packs to his face to reduce swelling.  He was reassured that  CT of his neck and face did not show any acute injury.  ____________________________________________   FINAL CLINICAL IMPRESSION(S) / ED DIAGNOSES  Final diagnoses:  Contusion of face, initial encounter  Fall in home, initial encounter     ED Discharge Orders        Ordered    HYDROcodone-acetaminophen (NORCO/VICODIN) 5-325 MG tablet  Every 6 hours PRN     05/23/17 0953       Note:  This document was prepared using Dragon voice recognition software and may include unintentional dictation errors.    Tommi Rumps, PA-C 05/23/17 1239    Sharman Cheek, MD 05/24/17 1538

## 2017-08-25 ENCOUNTER — Emergency Department
Admission: EM | Admit: 2017-08-25 | Discharge: 2017-08-25 | Disposition: A | Discharge status: Home / Self Care | Payer: 59 | Attending: Emergency Medicine | Admitting: Emergency Medicine

## 2017-08-25 ENCOUNTER — Emergency Department: Payer: 59

## 2017-08-25 ENCOUNTER — Other Ambulatory Visit: Payer: Self-pay

## 2017-08-25 ENCOUNTER — Encounter: Payer: Self-pay | Admitting: Emergency Medicine

## 2017-08-25 DIAGNOSIS — F17210 Nicotine dependence, cigarettes, uncomplicated: Secondary | ICD-10-CM | POA: Diagnosis not present

## 2017-08-25 DIAGNOSIS — G4730 Sleep apnea, unspecified: Secondary | ICD-10-CM | POA: Diagnosis not present

## 2017-08-25 DIAGNOSIS — R0602 Shortness of breath: Secondary | ICD-10-CM | POA: Diagnosis present

## 2017-08-25 LAB — COMPREHENSIVE METABOLIC PANEL
ALK PHOS: 51 U/L (ref 38–126)
ALT: 15 U/L (ref 0–44)
AST: 20 U/L (ref 15–41)
Albumin: 4.1 g/dL (ref 3.5–5.0)
Anion gap: 7 (ref 5–15)
BILIRUBIN TOTAL: 0.7 mg/dL (ref 0.3–1.2)
BUN: 14 mg/dL (ref 6–20)
CALCIUM: 8.5 mg/dL — AB (ref 8.9–10.3)
CO2: 26 mmol/L (ref 22–32)
Chloride: 104 mmol/L (ref 98–111)
Creatinine, Ser: 0.7 mg/dL (ref 0.61–1.24)
GFR calc Af Amer: >60 mL/min (ref >60)
GFR calc non Af Amer: >60 mL/min (ref >60)
Glucose, Bld: 106 mg/dL — ABNORMAL HIGH (ref 70–99)
POTASSIUM: 4.1 mmol/L (ref 3.5–5.1)
Sodium: 137 mmol/L (ref 135–145)
TOTAL PROTEIN: 6.5 g/dL (ref 6.5–8.1)

## 2017-08-25 LAB — CBC
HEMATOCRIT: 37.2 % — AB (ref 40.0–52.0)
Hemoglobin: 13 g/dL (ref 13.0–18.0)
MCH: 32.5 pg (ref 26.0–34.0)
MCHC: 35 g/dL (ref 32.0–36.0)
MCV: 92.8 fL (ref 80.0–100.0)
Platelets: 277 10*3/uL (ref 150–440)
RBC: 4.01 MIL/uL — ABNORMAL LOW (ref 4.40–5.90)
RDW: 14 % (ref 11.5–14.5)
WBC: 8 10*3/uL (ref 3.8–10.6)

## 2017-08-25 LAB — TROPONIN I

## 2017-08-25 LAB — BRAIN NATRIURETIC PEPTIDE: B Natriuretic Peptide: 51 pg/mL (ref 0.0–100.0)

## 2017-08-25 MED ORDER — IPRATROPIUM-ALBUTEROL 0.5-2.5 (3) MG/3ML IN SOLN
3.0000 mL | Freq: Once | RESPIRATORY_TRACT | Status: AC
  Administered 2017-08-25: 3 mL via RESPIRATORY_TRACT
  Filled 2017-08-25: qty 3

## 2017-08-25 NOTE — ED Provider Notes (Signed)
[facility] Emergency Department Provider Note   ____________________________________________    I have reviewed the triage vital signs and the nursing notes.   HISTORY  Chief Complaint Shortness of Breath     HPI Frederick Mendoza is a 42 y.o. male who presents with complaints of shortness of breath, primarily at night.  Patient reports that he feels congested in his chest and wakes up frequently throughout the night feeling short of breath.  This has been primarily over the last 2 days where it has been this severe.  He does report a history of smoking.  Denies history of COPD.  No fevers or chills.  No calf pain or swelling, no history of DVT.  No pleurisy.  Currently feels well and has no complaints.   Past Medical History:  Diagnosis Date  . Bursitis    left shoulder  . Pneumothorax     There are no active problems to display for this patient.   History reviewed. No pertinent surgical history.  Prior to Admission medications   Medication Sig Start Date End Date Taking? Authorizing Provider  HYDROcodone-acetaminophen (NORCO/VICODIN) 5-325 MG tablet Take 1 tablet by mouth every 6 (six) hours as needed for moderate pain. Patient not taking: Reported on 08/25/2017 05/23/17   Tommi RumpsSummers, Rhonda L, PA-C     Allergies Penicillins; Other; and Toradol [ketorolac tromethamine]  History reviewed. No pertinent family history.  Social History Social History   Tobacco Use  . Smoking status: Current Every Day Smoker    Packs/day: 1.00    Types: Cigarettes  . Smokeless tobacco: Former Engineer, waterUser  Substance Use Topics  . Alcohol use: Yes  . Drug use: No    Review of Systems  Constitutional: No fever/chills Eyes: No visual changes.  ENT: No sore throat. Cardiovascular: Denies chest pain. Respiratory: As above Gastrointestinal: No abdominal pain.  No nausea, no vomiting.   Genitourinary: Negative for dysuria. Musculoskeletal: Negative for back  pain. Skin: Negative for rash. Neurological: Negative for headaches or weakness   ____________________________________________   PHYSICAL EXAM:  VITAL SIGNS: ED Triage Vitals  Enc Vitals Group     BP 08/25/17 0740 132/81     Pulse Rate 08/25/17 0740 (!) 58     Resp 08/25/17 0740 18     Temp 08/25/17 0740 98.6 F (37 C)     Temp src --      SpO2 08/25/17 0740 98 %     Weight 08/25/17 0741 70.3 kg (155 lb)     Height 08/25/17 0741 1.803 m (5\' 11" )     Head Circumference --      Peak Flow --      Pain Score 08/25/17 0741 0     Pain Loc --      Pain Edu? --      Excl. in GC? --     Constitutional: Alert and oriented. No acute distress. Pleasant and interactive  Mouth/Throat: Mucous membranes are moist.  Pharynx normal no stridor  Cardiovascular: Normal rate, regular rhythm. Grossly normal heart sounds.  Good peripheral circulation. Respiratory: Normal respiratory effort.  No retractions.  No significant wheezing Gastrointestinal: Soft and nontender. No distention  Musculoskeletal: No lower extremity tenderness nor edema.  Warm and well perfused Neurologic:  Normal speech and language. No gross focal neurologic deficits are appreciated.  Skin:  Skin is warm, dry and intact. No rash noted. Psychiatric: Mood and affect are normal. Speech and behavior are normal.  ____________________________________________   LABS (  all labs ordered are listed, but only abnormal results are displayed)  Labs Reviewed  CBC - Abnormal; Notable for the following components:      Result Value   RBC 4.01 (*)    HCT 37.2 (*)    All other components within normal limits  COMPREHENSIVE METABOLIC PANEL - Abnormal; Notable for the following components:   Glucose, Bld 106 (*)    Calcium 8.5 (*)    All other components within normal limits  TROPONIN I  BRAIN NATRIURETIC PEPTIDE   ____________________________________________  EKG  ED ECG REPORT I, Jene Everyobert Anelis Hrivnak, the attending physician,  personally viewed and interpreted this ECG.  Date: 08/25/2017  Rhythm: normal sinus rhythm QRS Axis: normal Intervals: normal ST/T Wave abnormalities: normal Narrative Interpretation: no evidence of acute ischemia  ____________________________________________  RADIOLOGY  Chest x-ray negative for pneumonia pulmonary edema ____________________________________________   PROCEDURES  Procedure(s) performed: No  Procedures   Critical Care performed: No ____________________________________________   INITIAL IMPRESSION / ASSESSMENT AND PLAN / ED COURSE  Pertinent labs & imaging results that were available during my care of the patient were reviewed by me and considered in my medical decision making (see chart for details).  Patient presents with complaints of shortness of breath primarily at night when sleeping.  Differential includes sleep apnea, COPD, pulmonary edema.  We will check x-rays, labs and reevaluate  Work-up is quite reassuring, patient remains quite comfortable in the emergency department.  I suspect sleep apnea as the cause of his nighttime wake ups, he does describe a long history of snoring.  I will refer him to PCP for further work-up/sleep study.  Counseled smoking cessation    ____________________________________________   FINAL CLINICAL IMPRESSION(S) / ED DIAGNOSES  Final diagnoses:  Sleep apnea in adult        Note:  This document was prepared using Dragon voice recognition software and may include unintentional dictation errors.    Jene EveryKinner, Faron Whitelock, MD 08/25/17 1212

## 2017-08-25 NOTE — ED Triage Notes (Signed)
Pt presents with sob. States he awakens each morning with congestion in his head and chest and takes benadryl and it gets better. Pt has no hx of asthma, copd, chf. Pt is smoker and uses vapes. Pt alert & oriented with NAD noted.

## 2017-08-31 ENCOUNTER — Other Ambulatory Visit: Payer: Self-pay

## 2017-08-31 ENCOUNTER — Emergency Department
Admission: EM | Admit: 2017-08-31 | Discharge: 2017-08-31 | Disposition: A | Discharge status: Home / Self Care | Payer: 59 | Attending: Emergency Medicine | Admitting: Emergency Medicine

## 2017-08-31 DIAGNOSIS — Y93G1 Activity, food preparation and clean up: Secondary | ICD-10-CM | POA: Diagnosis not present

## 2017-08-31 DIAGNOSIS — Y92009 Unspecified place in unspecified non-institutional (private) residence as the place of occurrence of the external cause: Secondary | ICD-10-CM | POA: Insufficient documentation

## 2017-08-31 DIAGNOSIS — Y998 Other external cause status: Secondary | ICD-10-CM | POA: Diagnosis not present

## 2017-08-31 DIAGNOSIS — S61012A Laceration without foreign body of left thumb without damage to nail, initial encounter: Secondary | ICD-10-CM

## 2017-08-31 DIAGNOSIS — W260XXA Contact with knife, initial encounter: Secondary | ICD-10-CM | POA: Insufficient documentation

## 2017-08-31 DIAGNOSIS — F17210 Nicotine dependence, cigarettes, uncomplicated: Secondary | ICD-10-CM | POA: Diagnosis not present

## 2017-08-31 MED ORDER — SULFAMETHOXAZOLE-TRIMETHOPRIM 800-160 MG PO TABS: 1.0000 | ORAL_TABLET | Freq: Two times a day (BID) | ORAL | 0 refills | Status: DC

## 2017-08-31 MED ORDER — LIDOCAINE HCL (PF) 1 % IJ SOLN
5.0000 mL | Freq: Once | INTRAMUSCULAR | Status: AC
  Administered 2017-08-31: 5 mL
  Filled 2017-08-31: qty 5

## 2017-08-31 NOTE — ED Notes (Signed)
Pt presents with laceration to L thumb, pt states was filet-ing chicken when he sliced open his thumb. Pt presents with laceration to L thumb, bleeding controlled at this time. Pt thumb placed in betadine/saline solution. Pt states unknown when last tetanus shot was.

## 2017-08-31 NOTE — Discharge Instructions (Addendum)
You received 6 stitches to your left thumb today. I have provided you with RX for Septra, to prevent infection. Have sutures removed in 7-10 days. Can take Ibuprofen or Tylenol as needed for pain

## 2017-08-31 NOTE — ED Provider Notes (Signed)
[facility] Emergency Department Provider Note ____________________________________________  Time seen: 1435  I have reviewed the triage vital signs and the nursing notes.  HISTORY  Chief Complaint  Laceration   HPI CYPHER PAULE is a 42 y.o. male's to the ER today with complaint of left thumb laceration.  He reports he was preparing some chicken for dinner when he accidentally sliced his left thumb.  He was able to control the bleeding at home.  He reports left thumb pain, but no swelling.  Tetanus is up-to-date.  Past Medical History:  Diagnosis Date  . Bursitis    left shoulder  . Pneumothorax     There are no active problems to display for this patient.   History reviewed. No pertinent surgical history.  Prior to Admission medications   Medication Sig Start Date End Date Taking? Authorizing Provider  HYDROcodone-acetaminophen (NORCO/VICODIN) 5-325 MG tablet Take 1 tablet by mouth every 6 (six) hours as needed for moderate pain. Patient not taking: Reported on 08/25/2017 05/23/17   Tommi Rumps, PA-C  sulfamethoxazole-trimethoprim (BACTRIM DS,SEPTRA DS) 800-160 MG tablet Take 1 tablet by mouth 2 (two) times daily. 08/31/17   Lorre Munroe, NP    Allergies Penicillins; Other; and Toradol [ketorolac tromethamine]  No family history on file.  Social History Social History   Tobacco Use  . Smoking status: Current Every Day Smoker    Packs/day: 1.00    Types: Cigarettes  . Smokeless tobacco: Former Engineer, water Use Topics  . Alcohol use: Yes  . Drug use: No    Review of Systems  Constitutional: Negative for fever. Musculoskeletal: Positive for left thumb pain. Skin: Positive for laceration of left thumb. Neurological: Negative for focal weakness or numbness. ____________________________________________  PHYSICAL EXAM:  VITAL SIGNS: ED Triage Vitals [08/31/17 1615]  Enc Vitals Group     BP 126/81     Pulse Rate 93     Resp  16     Temp 98.4 F (36.9 C)     Temp Source Oral     SpO2 98 %     Weight 155 lb (70.3 kg)     Height 5\' 11"  (1.803 m)     Head Circumference      Peak Flow      Pain Score 8     Pain Loc      Pain Edu?      Excl. in GC?     Constitutional: Alert and oriented. Well appearing and in no distress. Cardiovascular: Radial pulse 2+ bilaterally. Musculoskeletal: Normal flexion and extension of the left thumb. Neurologic: Sensation intact to left upper extremity. Skin: 2 cm laceration noted of the left medial thumb. ____________________________________________  PROCEDURES  .Marland KitchenLaceration Repair Date/Time: 08/31/2017 5:16 PM Performed by: Lorre Munroe, NP Authorized by: Lorre Munroe, NP   Consent:    Consent obtained:  Verbal   Consent given by:  Patient   Risks discussed:  Infection, poor cosmetic result and poor wound healing   Alternatives discussed:  No treatment Anesthesia (see MAR for exact dosages):    Anesthesia method:  Local infiltration   Local anesthetic:  Lidocaine 1% w/o epi Laceration details:    Location:  Finger   Finger location:  L thumb   Length (cm):  2 Repair type:    Repair type:  Simple Pre-procedure details:    Preparation:  Patient was prepped and draped in usual sterile fashion Exploration:    Hemostasis achieved with:  Direct pressure   Wound exploration: wound explored through full range of motion and entire depth of wound probed and visualized     Contaminated: no   Treatment:    Area cleansed with:  Betadine and saline   Amount of cleaning:  Standard   Irrigation solution:  Sterile saline   Irrigation volume:  60 ml   Irrigation method:  Syringe Skin repair:    Repair method:  Sutures   Suture size:  5-0   Suture material:  Nylon   Suture technique:  Simple interrupted   Number of sutures:  6 Approximation:    Approximation:  Close Post-procedure details:    Dressing:  Splint for protection   Patient tolerance of procedure:   Tolerated well, no immediate complications  ____________________________________________  INITIAL IMPRESSION / ASSESSMENT AND PLAN / ED COURSE  Left Thumb Laceration:  Sutured- see procedure note RX for Sepra 1 tab PO BID x 7 days given for prophylaxis Wound care discussed Return in 7-10 days for suture removal   ____________________________________________  FINAL CLINICAL IMPRESSION(S) / ED DIAGNOSES  Final diagnoses:  Laceration of left thumb without foreign body without damage to nail, initial encounter      Lorre MunroeBaity, Railyn House W, NP 08/31/17 1719    Nita SickleVeronese, [place], MD 09/04/17 1435

## 2017-08-31 NOTE — ED Triage Notes (Signed)
Pt states he accidentally cut his left thumb while cutting chicken. Bleeding controlled.

## 2017-11-18 ENCOUNTER — Encounter: Payer: Self-pay | Admitting: Emergency Medicine

## 2017-11-18 DIAGNOSIS — Z5321 Procedure and treatment not carried out due to patient leaving prior to being seen by health care provider: Secondary | ICD-10-CM | POA: Insufficient documentation

## 2017-11-18 DIAGNOSIS — R5383 Other fatigue: Secondary | ICD-10-CM | POA: Diagnosis not present

## 2017-11-18 DIAGNOSIS — R05 Cough: Secondary | ICD-10-CM | POA: Insufficient documentation

## 2017-11-18 DIAGNOSIS — R509 Fever, unspecified: Secondary | ICD-10-CM | POA: Insufficient documentation

## 2017-11-18 NOTE — ED Triage Notes (Addendum)
Pt c/o fever, chills, cough and generalized fatigue since this AM, worsening throughout that evening. Pt last took tylenol at 2130. Fever of 101.52F in triage.

## 2017-11-19 ENCOUNTER — Emergency Department
Admission: EM | Admit: 2017-11-19 | Discharge: 2017-11-19 | Disposition: A | Discharge status: Home / Self Care | Payer: 59 | Attending: Emergency Medicine | Admitting: Emergency Medicine

## 2017-11-19 MED ORDER — ACETAMINOPHEN 500 MG PO TABS
1000.0000 mg | ORAL_TABLET | Freq: Once | ORAL | Status: DC
  Filled 2017-11-19: qty 2

## 2017-11-19 NOTE — ED Notes (Signed)
Pt called for influenza swab without response

## 2017-11-19 NOTE — ED Notes (Signed)
Pt called x3 without response since 0000

## 2018-01-13 ENCOUNTER — Encounter: Payer: Self-pay | Admitting: Emergency Medicine

## 2018-01-13 ENCOUNTER — Emergency Department
Admission: EM | Admit: 2018-01-13 | Discharge: 2018-01-13 | Disposition: A | Discharge status: Home / Self Care | Payer: 59 | Attending: Emergency Medicine | Admitting: Emergency Medicine

## 2018-01-13 DIAGNOSIS — Z79899 Other long term (current) drug therapy: Secondary | ICD-10-CM | POA: Diagnosis not present

## 2018-01-13 DIAGNOSIS — F17210 Nicotine dependence, cigarettes, uncomplicated: Secondary | ICD-10-CM | POA: Insufficient documentation

## 2018-01-13 DIAGNOSIS — M5442 Lumbago with sciatica, left side: Secondary | ICD-10-CM | POA: Insufficient documentation

## 2018-01-13 DIAGNOSIS — M545 Low back pain: Secondary | ICD-10-CM | POA: Diagnosis present

## 2018-01-13 MED ORDER — CYCLOBENZAPRINE HCL 10 MG PO TABS: ORAL_TABLET | ORAL | 0 refills | Status: DC

## 2018-01-13 MED ORDER — HYDROCODONE-ACETAMINOPHEN 5-325 MG PO TABS: 1.0000 | ORAL_TABLET | Freq: Four times a day (QID) | ORAL | 0 refills | Status: DC | PRN

## 2018-01-13 MED ORDER — PREDNISONE 10 MG PO TABS: ORAL_TABLET | ORAL | 0 refills | Status: DC

## 2018-01-13 MED ORDER — DEXAMETHASONE SODIUM PHOSPHATE 10 MG/ML IJ SOLN
10.0000 mg | Freq: Once | INTRAMUSCULAR | Status: AC
  Administered 2018-01-13: 10 mg via INTRAMUSCULAR
  Filled 2018-01-13: qty 1

## 2018-01-13 NOTE — Discharge Instructions (Addendum)
Follow-up with your primary care provider or can no clinic acute care if any continued problems.  You may also follow-up with Dr. Joice Lofts who is on-call for orthopedics if you continue to have problems and need for continued treatment.  Also consider having the steroid injections in the future.  Your medication was sent to your pharmacy.  Cyclobenzaprine is 1 at bedtime as needed for muscle spasms.  This medication could cause drowsiness.  Norco is 1 every 6 hours as needed for moderate to severe pain.  Also start on the prednisone beginning with 6 tablets today and tapering down by 1 tablet each day.  You may also use ice or heat to your back as needed for discomfort.

## 2018-01-13 NOTE — ED Triage Notes (Signed)
Pt reports hx of sciatica and was moving wood yesterday and now it is flared up. Pt reports pain is to left side.

## 2018-01-13 NOTE — ED Notes (Signed)
See triage note  Presents with lower back pain  Pain is mainly on left side   Hx of same  Ambulated slowly to room d/t pain

## 2018-01-13 NOTE — ED Provider Notes (Signed)
[facility] Emergency Department Provider Note  ____________________________________________   First MD Initiated Contact with Patient 01/13/18 [phone]     (approximate)  I have reviewed the triage vital signs and the nursing notes.   HISTORY  Chief Complaint Sciatica and Back Pain   HPI Frederick Mendoza is a 43 y.o. male presents to the ED with complaint of low back pain with left-sided sciatica.  Patient states that he was moving worried yesterday and now has a flareup to his back with radiation down his left leg.  He has a history of the same.  He denies any saddle anesthesias, incontinence of bowel or bladder.  Patient continues to be ambulatory without any assistance.  He has taken over-the-counter medication without any relief.  Patient has been seen in the past for his sciatica and was told that steroid injections to his "back" would help but he so far has refused to have this done.  Currently rates his pain as an 8 out of 10.  Past Medical History:  Diagnosis Date  . Bursitis    left shoulder  . Pneumothorax     There are no active problems to display for this patient.   History reviewed. No pertinent surgical history.  Prior to Admission medications   Medication Sig Start Date End Date Taking? Authorizing Provider  cyclobenzaprine (FLEXERIL) 10 MG tablet 1 tablet at bedtime for muscle spasms. 01/13/18   Tommi Rumps, PA-C  HYDROcodone-acetaminophen (NORCO/VICODIN) 5-325 MG tablet Take 1 tablet by mouth every 6 (six) hours as needed for moderate pain. 01/13/18   Tommi Rumps, PA-C  predniSONE (DELTASONE) 10 MG tablet Take 6 tablets  today, on day 2 take 5 tablets, day 3 take 4 tablets, day 4 take 3 tablets, day 5 take  2 tablets and 1 tablet the last day 01/13/18   Tommi Rumps, PA-C    Allergies Penicillins; Other; and Toradol [ketorolac tromethamine]  No family history on file.  Social History Social History   Tobacco Use  .  Smoking status: Current Every Day Smoker    Packs/day: 1.00    Types: Cigarettes  . Smokeless tobacco: Former Engineer, water Use Topics  . Alcohol use: Yes  . Drug use: No    Review of Systems Constitutional: No fever/chills Cardiovascular: Denies chest pain. Respiratory: Denies shortness of breath. Gastrointestinal: No abdominal pain.  No nausea, no vomiting. Genitourinary: Negative for dysuria. Musculoskeletal: Positive for low back pain with left leg radiculopathy. Skin: Negative for rash. Neurological: Negative for headaches. ____________________________________________   PHYSICAL EXAM:  VITAL SIGNS: ED Triage Vitals [01/13/18 0812]  Enc Vitals Group     BP 121/82     Pulse Rate 89     Resp 20     Temp 97.7 F (36.5 C)     Temp Source Oral     SpO2 100 %     Weight 160 lb (72.6 kg)     Height 6' (1.829 m)     Head Circumference      Peak Flow      Pain Score 8     Pain Loc      Pain Edu?      Excl. in GC?    Constitutional: Alert and oriented. Well appearing and in no acute distress. Eyes: Conjunctivae are normal.  Head: Atraumatic. Neck: No stridor.   Cardiovascular: Normal rate, regular rhythm. Grossly normal heart sounds.  Good peripheral circulation. Respiratory: Normal respiratory effort.  No  retractions. Lungs CTAB. Musculoskeletal: On examination of the back there is no gross deformity noted however there is moderate tenderness on palpation of the left lower lumbar paravertebral muscles and SI joint area.  No edema or discoloration is noted.  Good muscle strength bilaterally.  Straight leg raises were approximately 40 degrees on the left leg and right leg was approximately 45 degrees.  Patient is able to ambulate without assistance.  Range of motion is slow and guarded. Neurologic:  Normal speech and language. No gross focal neurologic deficits are appreciated.  Reflexes are 2+ bilaterally.  No gait instability. Skin:  Skin is warm, dry and intact. No  rash noted. Psychiatric: Mood and affect are normal. Speech and behavior are normal.  ____________________________________________   LABS (all labs ordered are listed, but only abnormal results are displayed)  Labs Reviewed - No data to display  PROCEDURES  Procedure(s) performed: None  Procedures  Critical Care performed: No  ____________________________________________   INITIAL IMPRESSION / ASSESSMENT AND PLAN / ED COURSE  As part of my medical decision making, I reviewed the following data within the electronic MEDICAL RECORD NUMBER Notes from prior ED visits and [place] Controlled Substance Database  Patient presents to the ED with complaint of left lower back pain with sciatica down his left leg.  Patient has a history of back problems and sciatica in the past in which he was told that he would need steroid injections but has refused thus far.  He states that his most recent flareup is due to loading wood yesterday.  He denies any saddle anesthesias or incontinence of bowel or bladder.  Patient was given Decadron 10 mg IM in the department after his physical exam was consistent with sciatica.  Patient was given a prescription for Norco 1 every 6 hours as needed #10, Flexeril 10 mg 1 at at bedtime #7 and a continued tapering dose of prednisone for the next 6 days.  He is to follow-up with his PCP or his orthopedist.  Also information about the orthopedist on call today was given to him should he wish to see someone else.  ____________________________________________   FINAL CLINICAL IMPRESSION(S) / ED DIAGNOSES  Final diagnoses:  Acute left-sided low back pain with left-sided sciatica     ED Discharge Orders         Ordered    HYDROcodone-acetaminophen (NORCO/VICODIN) 5-325 MG tablet  Every 6 hours PRN     01/13/18 0948    predniSONE (DELTASONE) 10 MG tablet     01/13/18 0948    cyclobenzaprine (FLEXERIL) 10 MG tablet     01/13/18 [id number]           Note:  This document was  prepared using Dragon voice recognition software and may include unintentional dictation errors.    Tommi RumpsSummers, Meade Hogeland L, PA-C 01/13/18 1053    Rockne MenghiniNorman, Anne-Caroline, MD 01/13/18 1212

## 2018-02-22 ENCOUNTER — Emergency Department
Admission: EM | Admit: 2018-02-22 | Discharge: 2018-02-22 | Disposition: A | Discharge status: Home / Self Care | Payer: 59 | Attending: Emergency Medicine | Admitting: Emergency Medicine

## 2018-02-22 ENCOUNTER — Other Ambulatory Visit: Payer: Self-pay

## 2018-02-22 DIAGNOSIS — Y93G1 Activity, food preparation and clean up: Secondary | ICD-10-CM | POA: Insufficient documentation

## 2018-02-22 DIAGNOSIS — S61512A Laceration without foreign body of left wrist, initial encounter: Secondary | ICD-10-CM | POA: Diagnosis not present

## 2018-02-22 DIAGNOSIS — W260XXA Contact with knife, initial encounter: Secondary | ICD-10-CM | POA: Insufficient documentation

## 2018-02-22 DIAGNOSIS — Y998 Other external cause status: Secondary | ICD-10-CM | POA: Diagnosis not present

## 2018-02-22 DIAGNOSIS — F17210 Nicotine dependence, cigarettes, uncomplicated: Secondary | ICD-10-CM | POA: Insufficient documentation

## 2018-02-22 DIAGNOSIS — Y929 Unspecified place or not applicable: Secondary | ICD-10-CM | POA: Diagnosis not present

## 2018-02-22 MED ORDER — LIDOCAINE HCL (PF) 1 % IJ SOLN
10.0000 mL | Freq: Once | INTRAMUSCULAR | Status: AC
  Administered 2018-02-22: 10 mL
  Filled 2018-02-22: qty 10

## 2018-02-22 NOTE — ED Notes (Addendum)
First nurse note: Pt presents to ED via [facility] c/o laceration to L wrist while cutting meat. Wrist wrapped by FD. Per EMS, pt has had "a couple beers" but is not slurring words or stumbling. Pt agitated that he came in by EMS and was sent to waiting room.

## 2018-02-22 NOTE — ED Notes (Signed)
Pt states that he was home cutting chicken when his knife slipped and he cut his right forearm area. Pt arm is bandaged and not actively bleeding

## 2018-02-22 NOTE — ED Notes (Signed)
Attempted to review discharge instructions with pt. Pt states "you don't need to talk, I know it all, I was in [place]." pt proceeds to review with RN s/s of infection and when to have sutures removed.

## 2018-02-22 NOTE — ED Triage Notes (Signed)
Patient reports he was cutting chicken and accidentally cut his left wrist.  Patient is able to feel and move all finger.   Patient in lobby cursing and shouting about getting sewn up.

## 2018-02-22 NOTE — ED Provider Notes (Signed)
[facility] Emergency Department Provider Note  ____________________________________________  Time seen: Approximately 7:37 PM  I have reviewed the triage vital signs and the nursing notes.   HISTORY  Chief Complaint Laceration    HPI Frederick Mendoza is a 43 y.o. male who presents emergency department complaining of a laceration to the left wrist.  Patient reports he was cutting meat with a knife.  He did have a few beers while doing this and accidentally cut himself in the left wrist.  Patient reports that he is able to move all digits and wrist appropriately.  He was able to control bleeding with direct pressure.  Patient denies any other injury or complaint at this time.  He denies any intent of self-harm.  Patient reports that the last shot was approximately 4 months ago.  Patient was very upset, screaming and cursing in the waiting room.  Patient is still agitated but is not cursing during my initial interview.  Patient is requesting "hurry up so I can get out of here."  Patient is agreeable to sutures at this time.    Past Medical History:  Diagnosis Date  . Bursitis    left shoulder  . Pneumothorax     There are no active problems to display for this patient.   No past surgical history on file.  Prior to Admission medications   Medication Sig Start Date End Date Taking? Authorizing Provider  cyclobenzaprine (FLEXERIL) 10 MG tablet 1 tablet at bedtime for muscle spasms. 01/13/18   Tommi Rumps, PA-C  HYDROcodone-acetaminophen (NORCO/VICODIN) 5-325 MG tablet Take 1 tablet by mouth every 6 (six) hours as needed for moderate pain. 01/13/18   Tommi Rumps, PA-C  predniSONE (DELTASONE) 10 MG tablet Take 6 tablets  today, on day 2 take 5 tablets, day 3 take 4 tablets, day 4 take 3 tablets, day 5 take  2 tablets and 1 tablet the last day 01/13/18   Tommi Rumps, PA-C    Allergies Penicillins; Other; and Toradol [ketorolac tromethamine]  No  family history on file.  Social History Social History   Tobacco Use  . Smoking status: Current Every Day Smoker    Packs/day: 1.00    Types: Cigarettes  . Smokeless tobacco: Former Engineer, water Use Topics  . Alcohol use: Yes  . Drug use: No     Review of Systems  Constitutional: No fever/chills Eyes: No visual changes.  Cardiovascular: no chest pain. Respiratory: no cough. No SOB. Gastrointestinal: No abdominal pain.  No nausea, no vomiting.   Musculoskeletal: Negative for musculoskeletal pain. Skin: Positive for laceration to the left wrist. Neurological: Negative for headaches, focal weakness or numbness. 10-point ROS otherwise negative.  ____________________________________________   PHYSICAL EXAM:  VITAL SIGNS: ED Triage Vitals  Enc Vitals Group     BP 02/22/18 1917 (!) 116/91     Pulse Rate 02/22/18 1917 99     Resp 02/22/18 1917 20     Temp 02/22/18 1917 98.4 F (36.9 C)     Temp Source 02/22/18 1917 Oral     SpO2 02/22/18 1917 97 %     Weight 02/22/18 1915 155 lb (70.3 kg)     Height 02/22/18 1915 6' (1.829 m)     Head Circumference --      Peak Flow --      Pain Score --      Pain Loc --      Pain Edu? --  Excl. in GC? --      Constitutional: Alert and oriented. Well appearing and in no acute distress. Eyes: Conjunctivae are normal. PERRL. EOMI. Head: Atraumatic. Neck: No stridor.    Cardiovascular: Normal rate, regular rhythm. Normal S1 and S2.  Good peripheral circulation. Respiratory: Normal respiratory effort without tachypnea or retractions. Lungs CTAB. Good air entry to the bases with no decreased or absent breath sounds. Musculoskeletal: Full range of motion to all extremities. No gross deformities appreciated. Neurologic:  Normal speech and language. No gross focal neurologic deficits are appreciated.  Skin:  Skin is warm, dry and intact. No rash noted.  Visualization of the left wrist reveals 2 superficial linear lacerations  running distal to proximal.  These both measure approximately 14 cm in length.  Patient also has a more substantial laceration running horizontally across wrist.  This is approximately 8 cm in length.  Subcutaneous tissue identified with no visible tendons or ligaments.  Bleeding is controlled at this time.  No foreign body.  Full range of motion to all 5 digits distally.  Radial pulse intact.  Capillary refill less than 2 seconds all digits.   Psychiatric: Mood and affect are normal. Speech and behavior are normal. Patient exhibits appropriate insight and judgement.   ____________________________________________   LABS (all labs ordered are listed, but only abnormal results are displayed)  Labs Reviewed - No data to display ____________________________________________  EKG   ____________________________________________  RADIOLOGY   No results found.  ____________________________________________    PROCEDURES  Procedure(s) performed:    Marland Kitchen.Marland Kitchen.Laceration Repair Date/Time: 02/22/2018 8:05 PM Performed by: Racheal Patchesuthriell, Jonathan D, PA-C Authorized by: Racheal Patchesuthriell, Jonathan D, PA-C   Consent:    Consent obtained:  Verbal   Consent given by:  Patient   Risks discussed:  Pain Anesthesia (see MAR for exact dosages):    Anesthesia method:  Local infiltration   Local anesthetic:  Lidocaine 1% WITH epi Laceration details:    Location:  Shoulder/arm   Shoulder/arm location:  L lower arm   Length (cm):  8 Repair type:    Repair type:  Simple Pre-procedure details:    Preparation:  Patient was prepped and draped in usual sterile fashion Exploration:    Hemostasis achieved with:  Direct pressure   Wound exploration: wound explored through full range of motion and entire depth of wound probed and visualized     Wound extent: no foreign bodies/material noted, no muscle damage noted, no nerve damage noted, no tendon damage noted, no underlying fracture noted and no vascular damage noted      Contaminated: no   Treatment:    Area cleansed with:  Betadine   Amount of cleaning:  Standard   Irrigation solution:  Sterile saline   Irrigation volume:  500 ml   Irrigation method:  Syringe Skin repair:    Repair method:  Sutures   Suture size:  4-0   Suture material:  Nylon   Suture technique:  Running locked   Number of sutures:  1 (7 throws) Approximation:    Approximation:  Close Post-procedure details:    Dressing:  Tube gauze   Patient tolerance of procedure:  Tolerated well, no immediate complications      Medications  lidocaine (PF) (XYLOCAINE) 1 % injection 10 mL (has no administration in time range)     ____________________________________________   INITIAL IMPRESSION / ASSESSMENT AND PLAN / ED COURSE  Pertinent labs & imaging results that were available during my care of the patient were reviewed  by me and considered in my medical decision making (see chart for details).  Review of the [place] CSRS was performed in accordance of the [facility] prior to dispensing any controlled drugs.      Patient's diagnosis is consistent with wrist laceration.  Patient presents the emergency department complaining of laceration to the left wrist.  Laceration was closed as described above with no complications.  Wound care instructions discussed with patient.  Sutures are to be removed in 1 week.  Tylenol Motrin at home as needed for pain.  Follow-up with primary care for suture removal.. Patient is given ED precautions to return to the ED for any worsening or new symptoms.     ____________________________________________  FINAL CLINICAL IMPRESSION(S) / ED DIAGNOSES  Final diagnoses:  Laceration of left wrist, initial encounter      NEW MEDICATIONS STARTED DURING THIS VISIT:  ED Discharge Orders    None          This chart was dictated using voice recognition software/Dragon. Despite best efforts to proofread, errors can occur which can change the meaning. Any  change was purely unintentional.    Lanette Hampshire 02/22/18 2007    Phineas Semen, MD 02/22/18 2047

## 2018-07-15 ENCOUNTER — Emergency Department
Admission: EM | Admit: 2018-07-15 | Discharge: 2018-07-15 | Disposition: A | Discharge status: Home / Self Care | Payer: Self-pay | Attending: Student in an Organized Health Care Education/Training Program | Admitting: Student in an Organized Health Care Education/Training Program

## 2018-07-15 ENCOUNTER — Emergency Department: Payer: Self-pay

## 2018-07-15 ENCOUNTER — Other Ambulatory Visit: Payer: Self-pay

## 2018-07-15 DIAGNOSIS — R109 Unspecified abdominal pain: Secondary | ICD-10-CM

## 2018-07-15 DIAGNOSIS — R100 Acute abdomen: Secondary | ICD-10-CM | POA: Insufficient documentation

## 2018-07-15 DIAGNOSIS — R0789 Other chest pain: Secondary | ICD-10-CM | POA: Insufficient documentation

## 2018-07-15 DIAGNOSIS — F17210 Nicotine dependence, cigarettes, uncomplicated: Secondary | ICD-10-CM | POA: Insufficient documentation

## 2018-07-15 DIAGNOSIS — R079 Chest pain, unspecified: Secondary | ICD-10-CM

## 2018-07-15 LAB — COMPREHENSIVE METABOLIC PANEL
ALT: 23 U/L (ref 0–44)
AST: 35 U/L (ref 15–41)
Albumin: 4.1 g/dL (ref 3.5–5.0)
Alkaline Phosphatase: 60 U/L (ref 38–126)
Anion gap: 6 (ref 5–15)
BUN: 8 mg/dL (ref 6–20)
CO2: 23 mmol/L (ref 22–32)
Calcium: 8.9 mg/dL (ref 8.9–10.3)
Chloride: 108 mmol/L (ref 98–111)
Creatinine, Ser: 0.61 mg/dL (ref 0.61–1.24)
GFR calc Af Amer: >60 mL/min (ref >60)
GFR calc non Af Amer: >60 mL/min (ref >60)
Glucose, Bld: 103 mg/dL — ABNORMAL HIGH (ref 70–99)
Potassium: 3.9 mmol/L (ref 3.5–5.1)
Sodium: 137 mmol/L (ref 135–145)
Total Bilirubin: 1.7 mg/dL — ABNORMAL HIGH (ref 0.3–1.2)
Total Protein: 7 g/dL (ref 6.5–8.1)

## 2018-07-15 LAB — CBC WITH DIFFERENTIAL/PLATELET
Abs Immature Granulocytes: 0.02 10*3/uL (ref 0.00–0.07)
Basophils Absolute: 0.1 10*3/uL (ref 0.0–0.1)
Basophils Relative: 1 %
Eosinophils Absolute: 0.2 10*3/uL (ref 0.0–0.5)
Eosinophils Relative: 3 %
HCT: 41.2 % (ref 39.0–52.0)
Hemoglobin: 13.8 g/dL (ref 13.0–17.0)
Immature Granulocytes: 0 %
Lymphocytes Relative: 30 %
Lymphs Abs: 1.9 10*3/uL (ref 0.7–4.0)
MCH: 31.7 pg (ref 26.0–34.0)
MCHC: 33.5 g/dL (ref 30.0–36.0)
MCV: 94.5 fL (ref 80.0–100.0)
Monocytes Absolute: 0.7 10*3/uL (ref 0.1–1.0)
Monocytes Relative: 11 %
Neutro Abs: 3.4 10*3/uL (ref 1.7–7.7)
Neutrophils Relative %: 55 %
Platelets: 285 10*3/uL (ref 150–400)
RBC: 4.36 MIL/uL (ref 4.22–5.81)
RDW: 13.4 % (ref 11.5–15.5)
WBC: 6.4 10*3/uL (ref 4.0–10.5)
nRBC: 0 % (ref 0.0–0.2)

## 2018-07-15 LAB — URINALYSIS, COMPLETE (UACMP) WITH MICROSCOPIC
Bacteria, UA: NONE SEEN
Bilirubin Urine: NEGATIVE
Glucose, UA: NEGATIVE mg/dL
Ketones, ur: NEGATIVE mg/dL
Leukocytes,Ua: NEGATIVE
Nitrite: NEGATIVE
Protein, ur: NEGATIVE mg/dL
Specific Gravity, Urine: 1.012 (ref 1.005–1.030)
Squamous Epithelial / LPF: NONE SEEN (ref 0–5)
pH: 7 (ref 5.0–8.0)

## 2018-07-15 LAB — TROPONIN I (HIGH SENSITIVITY): Troponin I (High Sensitivity): 3 ng/L (ref <18)

## 2018-07-15 MED ORDER — HYDROCODONE-ACETAMINOPHEN 5-325 MG PO TABS: 1.0000 | ORAL_TABLET | ORAL | 0 refills | Status: DC | PRN

## 2018-07-15 MED ORDER — HYDROCODONE-ACETAMINOPHEN 5-325 MG PO TABS
1.0000 | ORAL_TABLET | Freq: Once | ORAL | Status: AC
  Administered 2018-07-15: 1 via ORAL
  Filled 2018-07-15: qty 1

## 2018-07-15 NOTE — ED Triage Notes (Signed)
Pt c/o lower to mid back for the past week with intermittent left sided chest pain. Denies N/V/D/fever/cough

## 2018-07-15 NOTE — ED Notes (Signed)
Patient ambulatory to lobby with steady gait and NAD distress. Patient verbalized understanding of discharge instructions, medications and follow-up care.

## 2018-07-15 NOTE — ED Provider Notes (Signed)
[facility] Emergency Department Provider Note    First MD Initiated Contact with Patient 07/15/18 [phone]     (approximate)  I have reviewed the triage vital signs and the nursing notes.   HISTORY  Chief Complaint Chest Pain    HPI Frederick Mendoza is a 43 y.o. male the below listed past medical history presents the ER for evaluation of intermittent "stabbing " brief episodes of left-sided chest pain for the past week.  Episodes last only a few seconds and happen frequently throughout the day.  Patient states he has a history of chronic low back pain and sciatica feels similar but in his left chest which is new.  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  Denies any pressure.  No shortness of breath.  States he does have a history of spontaneous pneumothorax for this feels different.  Denies any nausea vomiting or abdominal pain.  No recent fevers.  No cough.    Past Medical History:  Diagnosis Date  . Bursitis    left shoulder  . Pneumothorax    No family history on file. History reviewed. No pertinent surgical history. There are no active problems to display for this patient.     Prior to Admission medications   Medication Sig Start Date End Date Taking? Authorizing Provider  ibuprofen (ADVIL) 200 MG tablet Take 200 mg by mouth every 6 (six) hours as needed.   Yes [provider]  HYDROcodone-acetaminophen (NORCO) 5-325 MG tablet Take 1 tablet by mouth every 4 (four) hours as needed for moderate pain. 07/15/18   Willy Eddyobinson, Serena Petterson, MD    Allergies Penicillins, Other, and Toradol [ketorolac tromethamine]    Social History Social History   Tobacco Use  . Smoking status: Current Every Day Smoker    Packs/day: 1.00    Types: Cigarettes  . Smokeless tobacco: Former Engineer, waterUser  Substance Use Topics  . Alcohol use: Yes  . Drug use: No    Review of Systems Patient denies  headaches, rhinorrhea, blurry vision, numbness, shortness of breath, chest pain, edema, cough, abdominal pain, nausea, vomiting, diarrhea, dysuria, fevers, rashes or hallucinations unless otherwise stated above in HPI. ____________________________________________   PHYSICAL EXAM:  VITAL SIGNS: Vitals:   07/15/18 0800 07/15/18 0830  BP: 119/84 125/71  Pulse: 61 64  Resp: 15 16  Temp:    SpO2: 100% 100%    Constitutional: Alert and oriented.  Eyes: Conjunctivae are normal.  Head: Atraumatic. Nose: No congestion/rhinnorhea. Mouth/Throat: Mucous membranes are moist.   Neck: No stridor. Painless ROM.  Cardiovascular: Normal rate, regular rhythm. Grossly normal heart sounds.  Good peripheral circulation in all four extremities Respiratory: Normal respiratory effort.  No retractions. Lungs CTAB. Gastrointestinal: Soft and nontender. No distention. No abdominal bruits. No CVA tenderness. Genitourinary:  Musculoskeletal: No lower extremity tenderness nor edema.  No joint effusions. Neurologic:  Normal speech and language. No gross focal neurologic deficits are appreciated. No facial droop Skin:  Skin is warm, dry and intact. No rash noted. Psychiatric: Mood and affect are normal.  ____________________________________________   LABS (all labs ordered are listed, but only abnormal results are displayed)  Results for orders placed or performed during the hospital encounter of 07/15/18 (from the past 24 hour(s))  CBC with Differential/Platelet     Status: None   Collection Time: 07/15/18  7:35 AM  Result Value Ref Range   WBC 6.4 4.0 -  10.5 K/uL   RBC 4.36 4.22 - 5.81 MIL/uL   Hemoglobin 13.8 13.0 - 17.0 g/dL   HCT 41.2 39.0 - 52.0 %   MCV 94.5 80.0 - 100.0 fL   MCH 31.7 26.0 - 34.0 pg   MCHC 33.5 30.0 - 36.0 g/dL   RDW 13.4 11.5 - 15.5 %   Platelets 285 150 - 400 K/uL   nRBC 0.0 0.0 - 0.2 %   Neutrophils Relative % 55 %   Neutro Abs 3.4 1.7 - 7.7 K/uL   Lymphocytes Relative 30  %   Lymphs Abs 1.9 0.7 - 4.0 K/uL   Monocytes Relative 11 %   Monocytes Absolute 0.7 0.1 - 1.0 K/uL   Eosinophils Relative 3 %   Eosinophils Absolute 0.2 0.0 - 0.5 K/uL   Basophils Relative 1 %   Basophils Absolute 0.1 0.0 - 0.1 K/uL   Immature Granulocytes 0 %   Abs Immature Granulocytes 0.02 0.00 - 0.07 K/uL  Comprehensive metabolic panel     Status: Abnormal   Collection Time: 07/15/18  7:35 AM  Result Value Ref Range   Sodium 137 135 - 145 mmol/L   Potassium 3.9 3.5 - 5.1 mmol/L   Chloride 108 98 - 111 mmol/L   CO2 23 22 - 32 mmol/L   Glucose, Bld 103 (H) 70 - 99 mg/dL   BUN 8 6 - 20 mg/dL   Creatinine, Ser 0.61 0.61 - 1.24 mg/dL   Calcium 8.9 8.9 - 10.3 mg/dL   Total Protein 7.0 6.5 - 8.1 g/dL   Albumin 4.1 3.5 - 5.0 g/dL   AST 35 15 - 41 U/L   ALT 23 0 - 44 U/L   Alkaline Phosphatase 60 38 - 126 U/L   Total Bilirubin 1.7 (H) 0.3 - 1.2 mg/dL   GFR calc non Af Amer >60 >60 mL/min   GFR calc Af Amer >60 >60 mL/min   Anion gap 6 5 - 15  Troponin I (High Sensitivity)     Status: None   Collection Time: 07/15/18  7:35 AM  Result Value Ref Range   Troponin I (High Sensitivity) 3 <18 ng/L  Urinalysis, Complete w Microscopic     Status: Abnormal   Collection Time: 07/15/18  8:16 AM  Result Value Ref Range   Color, Urine YELLOW (A) YELLOW   APPearance CLEAR (A) CLEAR   Specific Gravity, Urine 1.012 1.005 - 1.030   pH 7.0 5.0 - 8.0   Glucose, UA NEGATIVE NEGATIVE mg/dL   Hgb urine dipstick SMALL (A) NEGATIVE   Bilirubin Urine NEGATIVE NEGATIVE   Ketones, ur NEGATIVE NEGATIVE mg/dL   Protein, ur NEGATIVE NEGATIVE mg/dL   Nitrite NEGATIVE NEGATIVE   Leukocytes,Ua NEGATIVE NEGATIVE   RBC / HPF 0-5 0 - 5 RBC/hpf   WBC, UA 0-5 0 - 5 WBC/hpf   Bacteria, UA NONE SEEN NONE SEEN   Squamous Epithelial / LPF NONE SEEN 0 - 5   Mucus PRESENT    ____________________________________________  EKG My review and personal interpretation at Time: 7:14   Indication: chest pain  Rate:  55  Rhythm: sinus Axis: normal Other: concave upwards st segment deviation most c/w BER, no reciprocal changes, no stemi ____________________________________________  RADIOLOGY  I personally reviewed all radiographic images ordered to evaluate for the above acute complaints and reviewed radiology reports and findings.  These findings were personally discussed with the patient.  Please see medical record for radiology report.  ____________________________________________   PROCEDURES  Procedure(s) performed:  Procedures    Critical Care performed: no ____________________________________________   INITIAL IMPRESSION / ASSESSMENT AND PLAN / ED COURSE  Pertinent labs & imaging results that were available during my care of the patient were reviewed by me and considered in my medical decision making (see chart for details).   DDX: ACS, pericarditis, esophagitis, boerhaaves, pe, dissection, pna, bronchitis, costochondritis   Frederick Mendoza is a 43 y.o. who presents to the ED with symptoms as described above.  Seems primarily musculoskeletal.  Chest x-ray does not show any evidence of pneumothorax.  Patient is low risk by heart score and with intermittent chest pains ongoing for the past week will order enzyme to further re-stratify but have a lower suspicion for ACS.  He is low risk by Wells criteria and is PERC negative.   Clinical Course as of Jul 15 934  Wed Jul 15, 2018  0858 Patient does have trace hemoglobin in his urine.  He is complaining of worsening flank pain.  Will order CT imaging to evaluate for any evidence of stone hydro-or renal pathology.   [PR]  0935 T imaging shows no evidence of acute abnormality.  Patient remains pain-free.  Given his symptoms lasting over 1 week do not feel that he needs further cardiac evaluation of the ER at this time will be appropriate for outpatient follow-up.  Seems primarily musculoskeletal.  Discussed referral and follow-up.  Discussed signs  and symptoms which the patient to return to the ER.   [PR]    Clinical Course User Index [PR] Willy Eddyobinson, Elaya Droege, MD    The patient was evaluated in Emergency Department today for the symptoms described in the history of present illness. He/she was evaluated in the context of the global COVID-19 pandemic, which necessitated consideration that the patient might be at risk for infection with the SARS-CoV-2 virus that causes COVID-19. Institutional protocols and algorithms that pertain to the evaluation of patients at risk for COVID-19 are in a state of rapid change based on information released by regulatory bodies including the CDC and federal and state organizations. These policies and algorithms were followed during the patient's care in the ED.  As part of my medical decision making, I reviewed the following data within the electronic MEDICAL RECORD NUMBER Nursing notes reviewed and incorporated, Labs reviewed, notes from prior ED visits and [place] Controlled Substance Database   ____________________________________________   FINAL CLINICAL IMPRESSION(S) / ED DIAGNOSES  Final diagnoses:  Chest pain, unspecified type  Flank pain, acute      NEW MEDICATIONS STARTED DURING THIS VISIT:  New Prescriptions   HYDROCODONE-ACETAMINOPHEN (NORCO) 5-325 MG TABLET    Take 1 tablet by mouth every 4 (four) hours as needed for moderate pain.     Note:  This document was prepared using Dragon voice recognition software and may include unintentional dictation errors.    Willy Eddyobinson, Resha Filippone, MD 07/15/18 [phone]

## 2018-10-14 ENCOUNTER — Other Ambulatory Visit: Payer: Self-pay

## 2018-10-14 DIAGNOSIS — Z20822 Contact with and (suspected) exposure to covid-19: Secondary | ICD-10-CM

## 2018-10-15 LAB — NOVEL CORONAVIRUS, NAA: SARS-CoV-2, NAA: NOT DETECTED

## 2018-10-23 ENCOUNTER — Telehealth: Payer: Self-pay | Admitting: General Practice

## 2018-10-23 NOTE — Telephone Encounter (Signed)
Negative COVID results given. Patient results "NOT Detected." Caller expressed understanding. ° °

## 2019-05-25 ENCOUNTER — Emergency Department
Admission: EM | Admit: 2019-05-25 | Discharge: 2019-05-25 | Disposition: A | Discharge status: Home / Self Care | Payer: Self-pay | Attending: Emergency Medicine | Admitting: Emergency Medicine

## 2019-05-25 ENCOUNTER — Emergency Department: Payer: Self-pay

## 2019-05-25 ENCOUNTER — Encounter: Payer: Self-pay | Admitting: Emergency Medicine

## 2019-05-25 ENCOUNTER — Other Ambulatory Visit: Payer: Self-pay

## 2019-05-25 DIAGNOSIS — K5732 Diverticulitis of large intestine without perforation or abscess without bleeding: Secondary | ICD-10-CM | POA: Insufficient documentation

## 2019-05-25 LAB — BASIC METABOLIC PANEL
Anion gap: 10 (ref 5–15)
BUN: 18 mg/dL (ref 6–20)
CO2: 23 mmol/L (ref 22–32)
Calcium: 9.2 mg/dL (ref 8.9–10.3)
Chloride: 102 mmol/L (ref 98–111)
Creatinine, Ser: 0.74 mg/dL (ref 0.61–1.24)
GFR calc Af Amer: >60 mL/min (ref >60)
GFR calc non Af Amer: >60 mL/min (ref >60)
Glucose, Bld: 102 mg/dL — ABNORMAL HIGH (ref 70–99)
Potassium: 4 mmol/L (ref 3.5–5.1)
Sodium: 135 mmol/L (ref 135–145)

## 2019-05-25 LAB — CBC
HCT: 41.8 % (ref 39.0–52.0)
Hemoglobin: 14.2 g/dL (ref 13.0–17.0)
MCH: 31 pg (ref 26.0–34.0)
MCHC: 34 g/dL (ref 30.0–36.0)
MCV: 91.3 fL (ref 80.0–100.0)
Platelets: 285 10*3/uL (ref 150–400)
RBC: 4.58 MIL/uL (ref 4.22–5.81)
RDW: 13.8 % (ref 11.5–15.5)
WBC: 14.1 10*3/uL — ABNORMAL HIGH (ref 4.0–10.5)
nRBC: 0 % (ref 0.0–0.2)

## 2019-05-25 LAB — LIPASE, BLOOD: Lipase: 22 U/L (ref 11–51)

## 2019-05-25 MED ORDER — HYDROMORPHONE HCL 1 MG/ML IJ SOLN
1.0000 mg | Freq: Once | INTRAMUSCULAR | Status: AC
  Administered 2019-05-25: 1 mg via INTRAVENOUS
  Filled 2019-05-25: qty 1

## 2019-05-25 MED ORDER — METRONIDAZOLE 500 MG PO TABS
500.0000 mg | ORAL_TABLET | Freq: Once | ORAL | Status: AC
  Administered 2019-05-25: 500 mg via ORAL
  Filled 2019-05-25: qty 1

## 2019-05-25 MED ORDER — CIPROFLOXACIN HCL 500 MG PO TABS
500.0000 mg | ORAL_TABLET | Freq: Two times a day (BID) | ORAL | 0 refills | Status: AC
Start: 2019-05-25 — End: 2019-06-04

## 2019-05-25 MED ORDER — ONDANSETRON HCL 4 MG/2ML IJ SOLN
4.0000 mg | Freq: Once | INTRAMUSCULAR | Status: AC
  Administered 2019-05-25: 4 mg via INTRAVENOUS
  Filled 2019-05-25: qty 2

## 2019-05-25 MED ORDER — SODIUM CHLORIDE 0.9 % IV SOLN: Freq: Once | INTRAVENOUS | Status: AC

## 2019-05-25 MED ORDER — CALCIUM POLYCARBOPHIL 625 MG PO TABS
625.0000 mg | ORAL_TABLET | Freq: Every day | ORAL | 2 refills | Status: AC
Start: 2019-05-25 — End: 2020-05-24

## 2019-05-25 MED ORDER — OXYCODONE-ACETAMINOPHEN 5-325 MG PO TABS: 1.0000 | ORAL_TABLET | Freq: Three times a day (TID) | ORAL | 0 refills | Status: DC | PRN

## 2019-05-25 MED ORDER — CIPROFLOXACIN HCL 500 MG PO TABS
500.0000 mg | ORAL_TABLET | Freq: Once | ORAL | Status: AC
  Administered 2019-05-25: 500 mg via ORAL
  Filled 2019-05-25: qty 1

## 2019-05-25 MED ORDER — METRONIDAZOLE 500 MG PO TABS: 500.0000 mg | ORAL_TABLET | Freq: Three times a day (TID) | ORAL | 0 refills | Status: AC

## 2019-05-25 NOTE — ED Provider Notes (Signed)
[facility] Emergency Department Provider Note       Time seen: ----------------------------------------- 8:02 AM on 05/25/2019 -----------------------------------------   I have reviewed the vital signs and the nursing notes.  HISTORY   Chief Complaint Flank Pain   HPI Frederick Mendoza is a 44 y.o. male with a history of bursitis, pneumothorax who presents today for sudden onset left flank pain that started last night.  Patient denies nausea, vomiting or diarrhea.  Discomfort is 8 out of 10 and sharp in the left flank.  Past Medical History:  Diagnosis Date  . Bursitis    left shoulder  . Pneumothorax     History reviewed. No pertinent surgical history.  Allergies Penicillins, Other, and Toradol [ketorolac tromethamine]   Review of Systems Constitutional: Negative for fever. Cardiovascular: Negative for chest pain. Respiratory: Negative for shortness of breath. Gastrointestinal: Positive for flank pain Musculoskeletal: Negative for back pain. Skin: Negative for rash. Neurological: Negative for headaches, focal weakness or numbness.  All systems negative/normal/unremarkable except as stated in the HPI  ____________________________________________   PHYSICAL EXAM:  VITAL SIGNS: ED Triage Vitals  Enc Vitals Group     BP 05/25/19 0759 (!) 144/79     Pulse Rate 05/25/19 0759 87     Resp 05/25/19 0759 20     Temp 05/25/19 0759 98.1 F (36.7 C)     Temp Source 05/25/19 0759 Oral     SpO2 05/25/19 0759 99 %     Weight 05/25/19 0757 145 lb (65.8 kg)     Height 05/25/19 0757 5\' 11"  (1.803 m)     Head Circumference --      Peak Flow --      Pain Score 05/25/19 0757 8     Pain Loc --      Pain Edu? --      Excl. in GC? --     Constitutional: Alert and oriented. Well appearing and in no distress. Eyes: Conjunctivae are normal. Normal extraocular movements. Cardiovascular: Normal rate, regular rhythm. No murmurs, rubs, or gallops. Respiratory: Normal  respiratory effort without tachypnea nor retractions. Breath sounds are clear and equal bilaterally. No wheezes/rales/rhonchi. Gastrointestinal: Soft and nontender. Normal bowel sounds Musculoskeletal: Nontender with normal range of motion in extremities. No lower extremity tenderness nor edema. Neurologic:  Normal speech and language. No gross focal neurologic deficits are appreciated.  Skin:  Skin is warm, dry and intact. No rash noted. Psychiatric: Speech and behavior are normal.   ____________________________________________   LABS (pertinent positives/negatives)  Labs Reviewed  BASIC METABOLIC PANEL - Abnormal; Notable for the following components:      Result Value   Glucose, Bld 102 (*)    All other components within normal limits  CBC - Abnormal; Notable for the following components:   WBC 14.1 (*)    All other components within normal limits  LIPASE, BLOOD  URINALYSIS, COMPLETE (UACMP) WITH MICROSCOPIC    RADIOLOGY  Images were viewed by me CT renal protocol IMPRESSION: 1. Inflammation involving the distal descending colon with scattered colonic diverticula. Findings are most compatible with acute diverticulitis. Trace fluid in the left paracolic gutter. No evidence for free air or abscess. 2. Negative for kidney stones or hydronephrosis.  DIFFERENTIAL DIAGNOSIS  Renal colic, UTI, pyelonephritis, muscle strain, radicular back pain  ASSESSMENT AND PLAN  Acute diverticulitis   Plan: The patient had presented for sudden onset flank pain. Patient's labs did indicate leukocytosis but no other acute process. Patient's imaging did reveal inflammation of the  distal descending colon and diverticulitis.  There was trace fluid but no perforation or abscess.  He was started on Cipro and Flagyl, will be encouraged to increase the fiber in his diet and will be referred for outpatient follow-up.  Lenise Arena MD    Note: This note was generated in part or whole with  voice recognition software. Voice recognition is usually quite accurate but there are transcription errors that can and very often do occur. I apologize for any typographical errors that were not detected and corrected.     Earleen Newport, MD 05/25/19 [phone]

## 2019-05-25 NOTE — ED Triage Notes (Signed)
Pt reports sudden onset of left flank pain last pm. Pt denies NVD or urinary sx's.

## 2020-01-09 IMAGING — CT CT RENAL STONE PROTOCOL
3 of 4 series · 8 of 46 positions shown, 15 images · non-contrast
Comparison: CT scan of April 13, 2012.

CLINICAL DATA: Acute bilateral flank pain.

EXAM:
CT ABDOMEN AND PELVIS WITHOUT CONTRAST
TECHNIQUE: Multidetector CT imaging of the abdomen and pelvis was performed
following the standard protocol without IV contrast.

[Series 4: lung bases · axial · 0.66mm/px · z∈[-847,-787]mm · 4 of 20 slices shown, 9 images]
[im 4/20  soft-tissue]
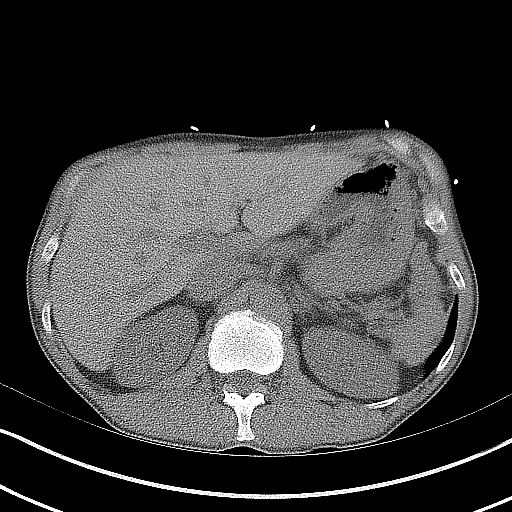
[im 4/20  lung]
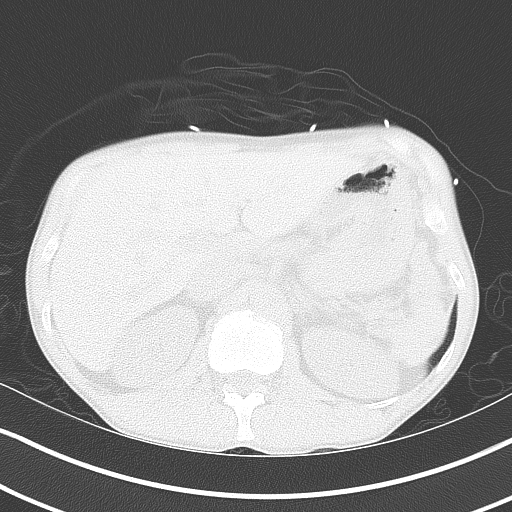
[im 4/20  bone]
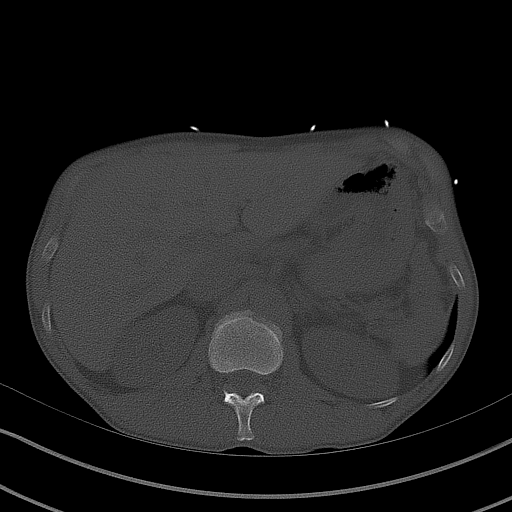
[im 8/20  soft-tissue]
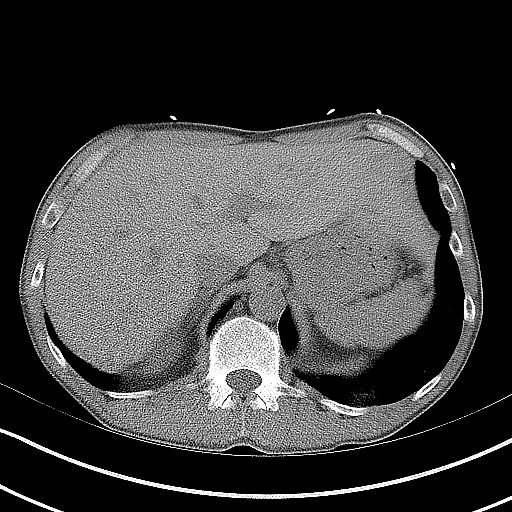
[im 8/20  lung]
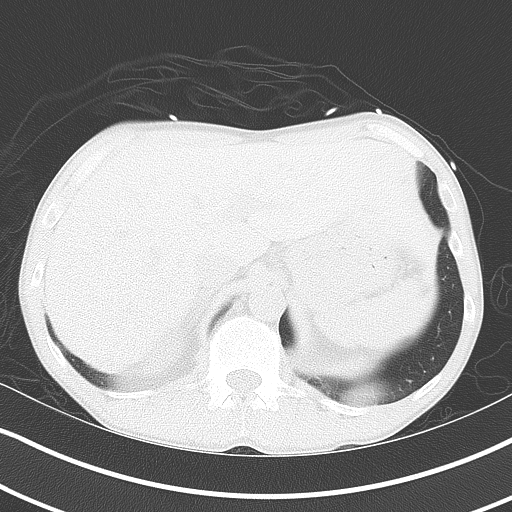
[im 12/20  soft-tissue]
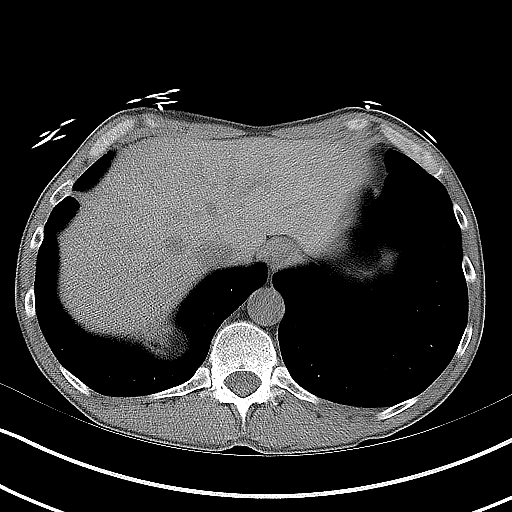
[im 12/20  lung]
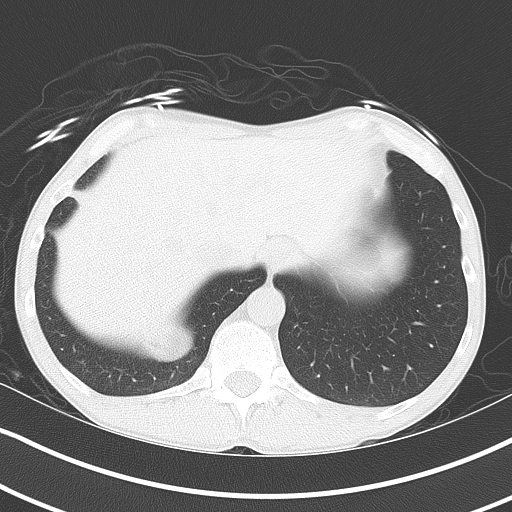
[im 16/20  soft-tissue]
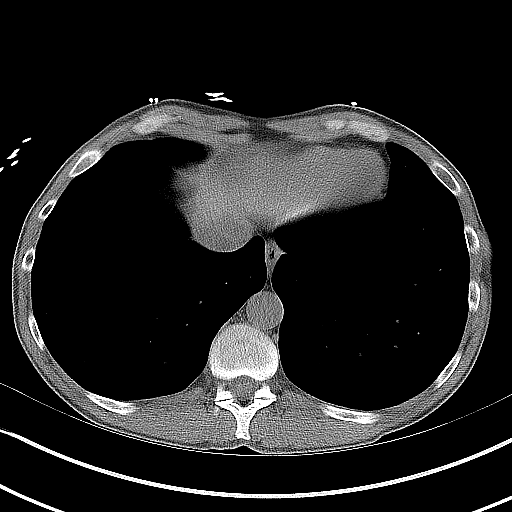
[im 16/20  lung]
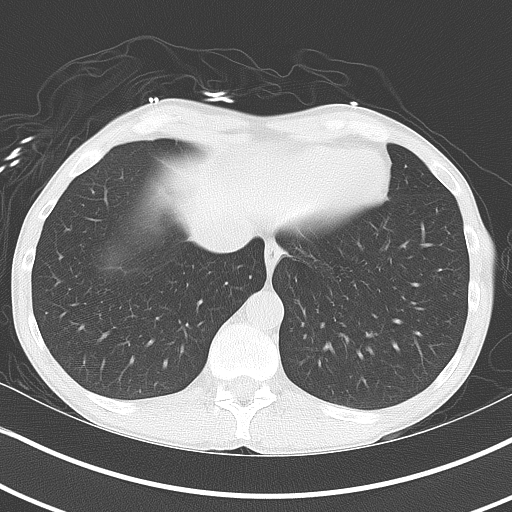

[Series 5: coronal · coronal · 0.65mm/px · 3 of 105 slices shown, 4 images]
[im 35/105  soft-tissue]
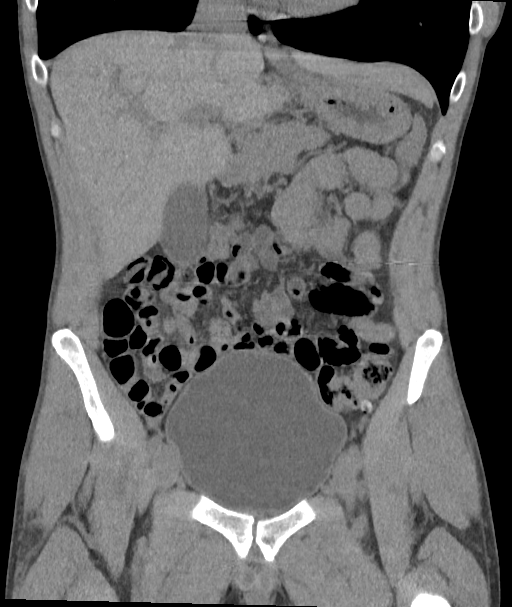
[im 47/105  soft-tissue]
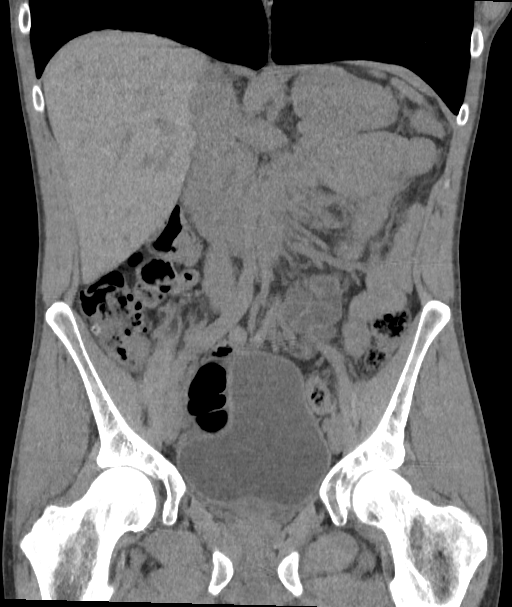
[im 47/105  bone]
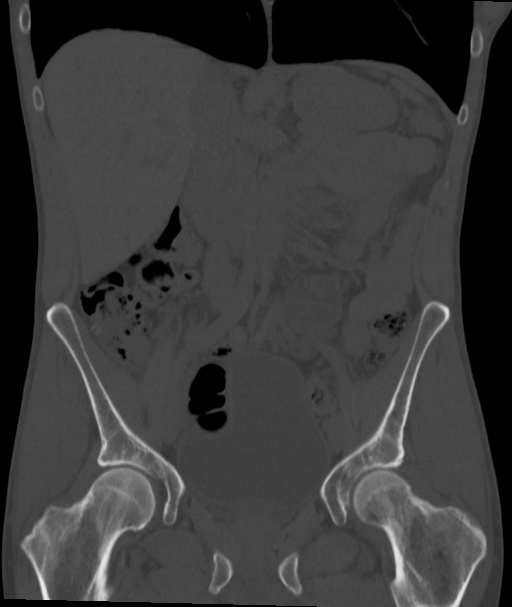
[im 58/105  soft-tissue]
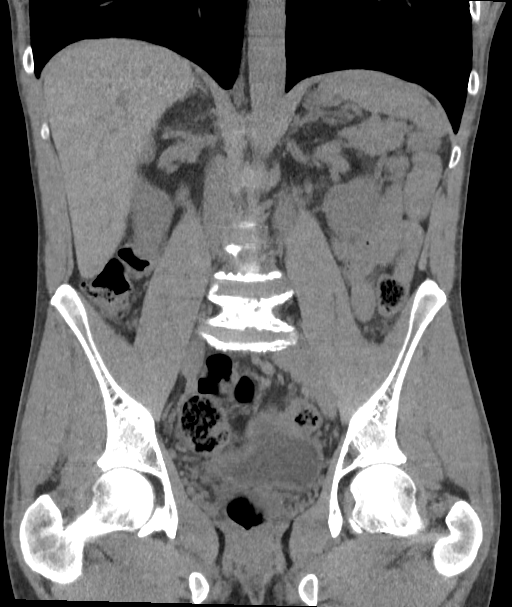

[Series 6: sagittal · sagittal · 0.49mm/px · 1 of 154 slices shown, 2 images]
[im 52/154  soft-tissue]
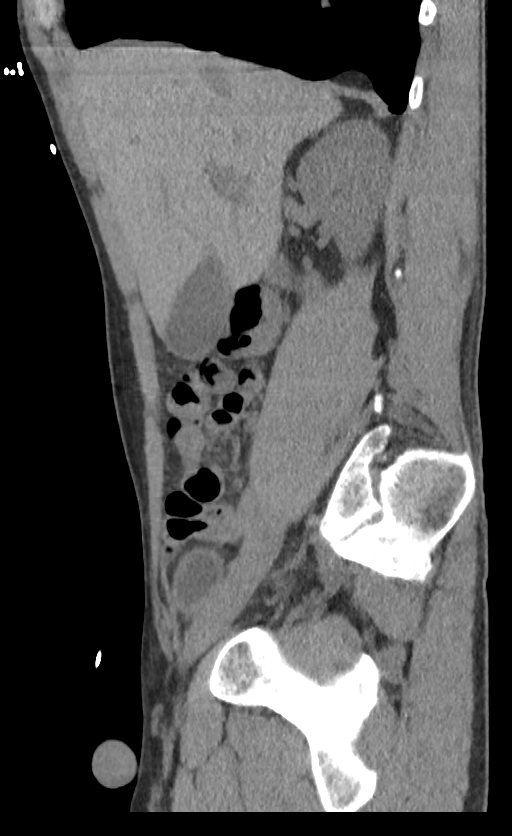
[im 52/154  bone]
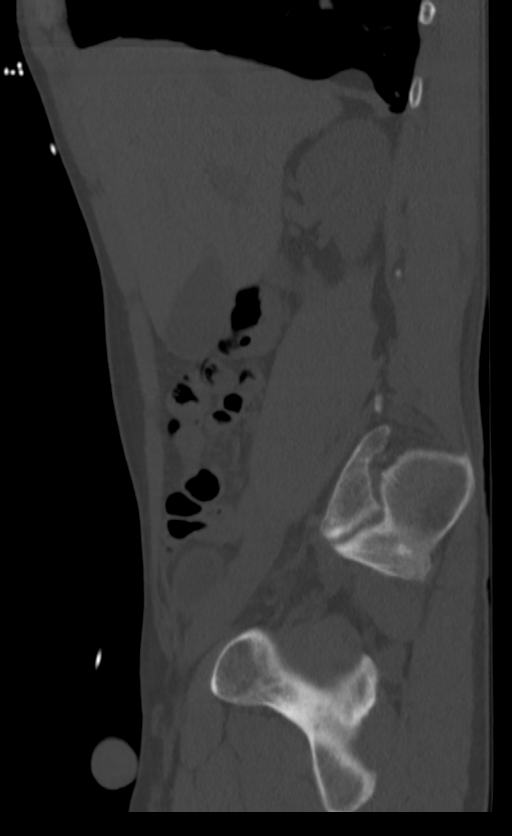

[8 of 46 positions shown; findings below may reference images not displayed]

FINDINGS: Lower chest: No acute abnormality.

Hepatobiliary: No focal liver abnormality is seen. No gallstones,
gallbladder wall thickening, or biliary dilatation.

Pancreas: Unremarkable. No pancreatic ductal dilatation or
surrounding inflammatory changes.

Spleen: Normal in size without focal abnormality.

Adrenals/Urinary Tract: Adrenal glands are unremarkable. Kidneys are
normal, without renal calculi, focal lesion, or hydronephrosis.
Bladder is unremarkable.

Stomach/Bowel: Stomach is within normal limits. Appendix appears
normal. No evidence of bowel wall thickening, distention, or
inflammatory changes.

Vascular/Lymphatic: No significant vascular findings are present. No
enlarged abdominal or pelvic lymph nodes.

Reproductive: Prostate is unremarkable.

Other: No abdominal wall hernia or abnormality. No abdominopelvic
ascites.

Musculoskeletal: No acute or significant osseous findings.
IMPRESSION: No abnormality seen in the abdomen or pelvis.

## 2020-07-05 ENCOUNTER — Other Ambulatory Visit: Payer: Self-pay

## 2020-07-05 ENCOUNTER — Ambulatory Visit (INDEPENDENT_AMBULATORY_CARE_PROVIDER_SITE_OTHER): Payer: Self-pay

## 2020-07-05 ENCOUNTER — Ambulatory Visit
Admission: EM | Admit: 2020-07-05 | Discharge: 2020-07-05 | Disposition: A | Discharge status: Home / Self Care | Payer: Self-pay | Attending: Family Medicine | Admitting: Family Medicine

## 2020-07-05 DIAGNOSIS — R5383 Other fatigue: Secondary | ICD-10-CM

## 2020-07-05 DIAGNOSIS — F17210 Nicotine dependence, cigarettes, uncomplicated: Secondary | ICD-10-CM | POA: Insufficient documentation

## 2020-07-05 DIAGNOSIS — R059 Cough, unspecified: Secondary | ICD-10-CM

## 2020-07-05 DIAGNOSIS — Z880 Allergy status to penicillin: Secondary | ICD-10-CM | POA: Insufficient documentation

## 2020-07-05 DIAGNOSIS — Z79899 Other long term (current) drug therapy: Secondary | ICD-10-CM | POA: Insufficient documentation

## 2020-07-05 DIAGNOSIS — B349 Viral infection, unspecified: Secondary | ICD-10-CM

## 2020-07-05 DIAGNOSIS — R197 Diarrhea, unspecified: Secondary | ICD-10-CM

## 2020-07-05 DIAGNOSIS — Z20822 Contact with and (suspected) exposure to covid-19: Secondary | ICD-10-CM | POA: Insufficient documentation

## 2020-07-05 DIAGNOSIS — R112 Nausea with vomiting, unspecified: Secondary | ICD-10-CM

## 2020-07-05 MED ORDER — PROMETHAZINE-DM 6.25-15 MG/5ML PO SYRP
5.0000 mL | ORAL_SOLUTION | Freq: Four times a day (QID) | ORAL | 0 refills | Status: DC | PRN
Start: 2020-07-05 — End: 2020-12-17

## 2020-07-05 MED ORDER — ONDANSETRON HCL 4 MG PO TABS
4.0000 mg | ORAL_TABLET | Freq: Three times a day (TID) | ORAL | 0 refills | Status: DC | PRN
Start: 2020-07-05 — End: 2020-12-17

## 2020-07-05 NOTE — ED Triage Notes (Signed)
Patient presents to Urgent Care with complaints of productive cough, chest discomfort with coughing and taking deep breaths, dizziness, nausea, chills, diarrhea, vomiting that started Saturday. Treating symptoms with benadryl and cough meds.  Denies fever.

## 2020-07-05 NOTE — ED Provider Notes (Signed)
[facility]    CSN: [id number] Arrival date & time: 07/05/20  1350      History   Chief Complaint No chief complaint on file.   HPI Frederick Mendoza is a 45 y.o. male presenting with 4-day history of feeling feverish, chills, fatigue, productive cough of greenish mucus, nasal congestion, sore throat, and sharp chest pains on deep breathing, feeling dizzy, nausea with a few episodes of vomiting, and a few episodes of diarrhea.  Patient says that his son has similar symptoms and someone that he works with also had similar symptoms but did not have COVID-19.  Patient took a COVID test at home today and it was negative.  He says that he has been vaccinated for COVID x1.  Admits to personal history of COVID-19 3 to 4 months ago.  Patient says the symptoms were very mild at that time.  He is currently taking over-the-counter DayQuil, Benadryl and NSAIDs for his symptoms.  He is a smoker but denies history of any respiratory diseases.  No other complaints or concerns.  HPI  Past Medical History:  Diagnosis Date   Bursitis    left shoulder   Pneumothorax     There are no problems to display for this patient.   History reviewed. No pertinent surgical history.     Home Medications    Prior to Admission medications   Medication Sig Start Date End Date Taking? Authorizing Provider  ondansetron (ZOFRAN) 4 MG tablet Take 1 tablet (4 mg total) by mouth every 8 (eight) hours as needed for nausea or vomiting. 07/05/20  Yes Shirlee Latch, PA-C  promethazine-dextromethorphan (PROMETHAZINE-DM) 6.25-15 MG/5ML syrup Take 5 mLs by mouth 4 (four) times daily as needed for cough. 07/05/20  Yes Eusebio Friendly B, PA-C  ibuprofen (ADVIL) 200 MG tablet Take 200 mg by mouth every 6 (six) hours as needed.    [provider]    Family History History reviewed. No pertinent family history.  Social History Social History   Tobacco Use   Smoking status: Every Day    Packs/day: 1.00     Pack years: 0.00    Types: Cigarettes   Smokeless tobacco: Former  Building services engineer Use: Every day  Substance Use Topics   Alcohol use: Yes   Drug use: No     Allergies   Penicillins, Other, and Toradol [ketorolac tromethamine]   Review of Systems Review of Systems  Constitutional:  Positive for fatigue. Negative for fever.  HENT:  Positive for congestion, rhinorrhea and sore throat. Negative for sinus pressure and sinus pain.   Respiratory:  Positive for cough. Negative for shortness of breath.   Cardiovascular:  Positive for chest pain.  Gastrointestinal:  Positive for diarrhea, nausea and vomiting. Negative for abdominal pain.  Musculoskeletal:  Negative for myalgias.  Neurological:  Positive for dizziness and headaches. Negative for weakness and light-headedness.  Hematological:  Negative for adenopathy.    Physical Exam Triage Vital Signs ED Triage Vitals  Enc Vitals Group     BP 07/05/20 1509 (!) 158/89     Pulse --      Resp 07/05/20 1509 16     Temp 07/05/20 1509 99.2 F (37.3 C)     Temp Source 07/05/20 1509 Oral     SpO2 07/05/20 1509 98 %     Weight --      Height --      Head Circumference --      Peak Flow --  Pain Score 07/05/20 1511 4     Pain Loc --      Pain Edu? --      Excl. in GC? --    No data found.  Updated Vital Signs BP (!) 158/89 (BP Location: Left Arm)   Pulse (!) 56   Temp 99.2 F (37.3 C) (Oral)   Resp 16   SpO2 98%      Physical Exam Vitals and nursing note reviewed.  Constitutional:      General: He is not in acute distress.    Appearance: Normal appearance. He is well-developed. He is ill-appearing. He is not diaphoretic.  HENT:     Head: Normocephalic and atraumatic.     Right Ear: Tympanic membrane, ear canal and external ear normal.     Left Ear: Tympanic membrane, ear canal and external ear normal.     Nose: Congestion and rhinorrhea present.     Mouth/Throat:     Mouth: Mucous membranes are moist.      Pharynx: Oropharynx is clear. Posterior oropharyngeal erythema present.  Eyes:     General: No scleral icterus.    Conjunctiva/sclera: Conjunctivae normal.  Cardiovascular:     Rate and Rhythm: Normal rate and regular rhythm.     Heart sounds: Normal heart sounds.  Pulmonary:     Effort: Pulmonary effort is normal. No respiratory distress.     Breath sounds: Normal breath sounds. No wheezing, rhonchi or rales.  Musculoskeletal:     Cervical back: Neck supple.  Skin:    General: Skin is warm and dry.  Neurological:     General: No focal deficit present.     Mental Status: He is alert. Mental status is at baseline.     Motor: No weakness.     Gait: Gait normal.  Psychiatric:        Mood and Affect: Mood normal.        Thought Content: Thought content normal.     UC Treatments / Results  Labs (all labs ordered are listed, but only abnormal results are displayed) Labs Reviewed  SARS CORONAVIRUS 2 (TAT 6-24 HRS)    EKG   Radiology DG Chest 2 View  Result Date: 07/05/2020 CLINICAL DATA:  Cough EXAM: CHEST - 2 VIEW COMPARISON:  June 2020 FINDINGS: Lungs are mildly hyperexpanded. No edema or airspace opacity. Heart size and pulmonary vascularity are normal. No adenopathy. No bone lesions. IMPRESSION: Lungs mildly hyperexpanded without edema or airspace opacity. Heart size within normal limits. No adenopathy. Electronically Signed   By: Bretta Bang III M.D.   On: 07/05/2020 16:18    Procedures Procedures (including critical care time)  Medications Ordered in UC Medications - No data to display  Initial Impression / Assessment and Plan / UC Course  I have reviewed the triage vital signs and the nursing notes.  Pertinent labs & imaging results that were available during my care of the patient were reviewed by me and considered in my medical decision making (see chart for details).  45 year old male presenting for 4-day history of fatigue, cough, congestion, sore  throat, sharp chest pains on breathing, dizziness, nausea/vomiting/diarrhea.  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  He is afebrile in the clinic today.   Patient is ill-appearing but nontoxic.  Exam significant for congestion, mild posterior pharyngeal erythema.  His chest is clear to auscultation heart regular rate and rhythm.  COVID is obtained.  Current CDC guidelines, isolation protocol  and ED precautions reviewed patient.  Chest x-ray obtained today due to patient complaint of sharp chest pains on breathing and productive cough.  Assessing for possibility of pneumonia.  X-ray reviewed by me.  No evidence of pneumonia confirmed by radiologist.  Reviewed results with patient.  Advised patient symptoms are consistent with a viral illness.  Supportive care encouraged with increasing rest and fluids.  Sent Promethazine DM and Zofran.  Work note has been given as well.  Advised him to follow-up as needed for any worsening symptoms or if is not feeling better over the next week.  ED precautions reviewed.  Final Clinical Impressions(s) / UC Diagnoses   Final diagnoses:  Viral illness  Cough  Fatigue, unspecified type  Nausea vomiting and diarrhea     Discharge Instructions      URI/COLD SYMPTOMS: Your exam today is consistent with a viral illness. Antibiotics are not indicated at this time. Use medications as directed, including cough syrup, nasal saline, and decongestants. Your symptoms should improve over the next few days and resolve within 7-10 days. Increase rest and fluids. F/u if symptoms worsen or predominate such as sore throat, ear pain, productive cough, shortness of breath, or if you develop high fevers or worsening fatigue over the next several days.    You have received COVID testing today either for positive exposure, concerning symptoms that could be related to COVID infection, screening purposes, or re-testing after confirmed positive.  Your  test obtained today checks for active viral infection in the last 1-2 weeks. If your test is negative now, you can still test positive later. So, if you do develop symptoms you should either get re-tested and/or isolate x 5 days and then strict mask use x 5 days (unvaccinated) or mask use x 10 days (vaccinated). Please follow CDC guidelines.  While Rapid antigen tests come back in 15-20 minutes, send out PCR/molecular test results typically come back within 1-3 days. In the mean time, if you are symptomatic, assume this could be a positive test and treat/monitor yourself as if you do have COVID.   We will call with test results if positive. Please download the MyChart app and set up a profile to access test results.   If symptomatic, go home and rest. Push fluids. Take Tylenol as needed for discomfort. Gargle warm salt water. Throat lozenges. Take Mucinex DM or Robitussin for cough. Humidifier in bedroom to ease coughing. Warm showers. Also review the COVID handout for more information.  COVID-19 INFECTION: The incubation period of COVID-19 is approximately 14 days after exposure, with most symptoms developing in roughly 4-5 days. Symptoms may range in severity from mild to critically severe. Roughly 80% of those infected will have mild symptoms. People of any age may become infected with COVID-19 and have the ability to transmit the virus. The most common symptoms include: fever, fatigue, cough, body aches, headaches, sore throat, nasal congestion, shortness of breath, nausea, vomiting, diarrhea, changes in smell and/or taste.    COURSE OF ILLNESS Some patients may begin with mild disease which can progress quickly into critical symptoms. If your symptoms are worsening please call ahead to the Emergency Department and proceed there for further treatment. Recovery time appears to be roughly 1-2 weeks for mild symptoms and 3-6 weeks for severe disease.   GO IMMEDIATELY TO ER FOR FEVER YOU ARE UNABLE TO  GET DOWN WITH TYLENOL, BREATHING PROBLEMS, CHEST PAIN, FATIGUE, LETHARGY, INABILITY TO EAT OR DRINK, ETC  QUARANTINE AND ISOLATION: To help  decrease the spread of COVID-19 please remain isolated if you have COVID infection or are highly suspected to have COVID infection. This means -stay home and isolate to one room in the home if you live with others. Do not share a bed or bathroom with others while ill, sanitize and wipe down all countertops and keep common areas clean and disinfected. Stay home for 5 days. If you have no symptoms or your symptoms are resolving after 5 days, you can leave your house. Continue to wear a mask around others for 5 additional days. If you have been in close contact (within 6 feet) of someone diagnosed with COVID 19, you are advised to quarantine in your home for 14 days as symptoms can develop anywhere from 2-14 days after exposure to the virus. If you develop symptoms, you  must isolate.  Most current guidelines for COVID after exposure -unvaccinated: isolate 5 days and strict mask use x 5 days. Test on day 5 is possible -vaccinated: wear mask x 10 days if symptoms do not develop -You do not necessarily need to be tested for COVID if you have + exposure and  develop symptoms. Just isolate at home x10 days from symptom onset During this global pandemic, CDC advises to practice social distancing, try to stay at least 58ft away from others at all times. Wear a face covering. Wash and sanitize your hands regularly and avoid going anywhere that is not necessary.  KEEP IN MIND THAT THE COVID TEST IS NOT 100% ACCURATE AND YOU SHOULD STILL DO EVERYTHING TO PREVENT POTENTIAL SPREAD OF VIRUS TO OTHERS (WEAR MASK, WEAR GLOVES, WASH HANDS AND SANITIZE REGULARLY). IF INITIAL TEST IS NEGATIVE, THIS MAY NOT MEAN YOU ARE DEFINITELY NEGATIVE. MOST ACCURATE TESTING IS DONE 5-7 DAYS AFTER EXPOSURE.   It is not advised by CDC to get re-tested after receiving a positive COVID test since you  can still test positive for weeks to months after you have already cleared the virus.   *If you have not been vaccinated for COVID, I strongly suggest you consider getting vaccinated as long as there are no contraindications.       ED Prescriptions     Medication Sig Dispense Auth. Provider   promethazine-dextromethorphan (PROMETHAZINE-DM) 6.25-15 MG/5ML syrup Take 5 mLs by mouth 4 (four) times daily as needed for cough. 118 mL Eusebio Friendly B, PA-C   ondansetron (ZOFRAN) 4 MG tablet Take 1 tablet (4 mg total) by mouth every 8 (eight) hours as needed for nausea or vomiting. 15 tablet Gareth Morgan      PDMP not reviewed this encounter.   Shirlee Latch, PA-C 07/05/20 1644

## 2020-07-05 NOTE — Discharge Instructions (Addendum)

## 2020-07-06 LAB — SARS CORONAVIRUS 2 (TAT 6-24 HRS): SARS Coronavirus 2: NEGATIVE

## 2020-12-17 ENCOUNTER — Emergency Department: Payer: Self-pay

## 2020-12-17 ENCOUNTER — Encounter: Payer: Self-pay | Admitting: Emergency Medicine

## 2020-12-17 ENCOUNTER — Emergency Department
Admission: EM | Admit: 2020-12-17 | Discharge: 2020-12-17 | Disposition: A | Discharge status: Home / Self Care | Payer: Self-pay | Attending: Emergency Medicine | Admitting: Emergency Medicine

## 2020-12-17 ENCOUNTER — Other Ambulatory Visit: Payer: Self-pay

## 2020-12-17 DIAGNOSIS — S92334A Nondisplaced fracture of third metatarsal bone, right foot, initial encounter for closed fracture: Secondary | ICD-10-CM | POA: Insufficient documentation

## 2020-12-17 DIAGNOSIS — F17210 Nicotine dependence, cigarettes, uncomplicated: Secondary | ICD-10-CM | POA: Insufficient documentation

## 2020-12-17 DIAGNOSIS — X58XXXA Exposure to other specified factors, initial encounter: Secondary | ICD-10-CM | POA: Insufficient documentation

## 2020-12-17 MED ORDER — IBUPROFEN 600 MG PO TABS
600.0000 mg | ORAL_TABLET | Freq: Four times a day (QID) | ORAL | 0 refills | Status: DC | PRN
Start: 2020-12-17 — End: 2022-07-21

## 2020-12-17 MED ORDER — HYDROCODONE-ACETAMINOPHEN 5-325 MG PO TABS: 1.0000 | ORAL_TABLET | Freq: Four times a day (QID) | ORAL | 0 refills | Status: AC | PRN

## 2020-12-17 NOTE — ED Triage Notes (Signed)
Pt reports a couple of days ago felt a pop in his right foot and now it is painful to walk on. Pt reports several weeks ago he stepped on a nail, was seen and treated in [place] and where he felt the pop is in the same location. Pt not sure if something is still stuck in him or what

## 2020-12-17 NOTE — Discharge Instructions (Signed)
Call make an appoint with Dr. Ether Griffins who is on-call for podiatry.  He is a foot doctor and can further evaluate your fracture.  Ice and elevation to reduce swelling and help with pain.  Wear wooden shoe when you are up walking to prevent your foot from bending.  Take ibuprofen with food for inflammation and pain.  The pain medication hydrocodone was sent to the pharmacy.  This medication is every 6 hours if needed for moderate to severe pain.  Do not drive or operate machinery while taking this medication.

## 2020-12-17 NOTE — ED Provider Notes (Signed)
[facility] Emergency Department Provider Note   ____________________________________________   Event Date/Time   First MD Initiated Contact with Patient 12/17/20 [phone]     (approximate)  I have reviewed the triage vital signs and the nursing notes.   HISTORY  Chief Complaint Foot Pain   HPI Frederick Mendoza is a 45 y.o. male presents to the ED with complaint of right foot pain.  Patient states that a few days ago he felt a pop in his right foot and has continued to have pain since that time.  Patient also reports that several weeks ago he stepped on a nail was seen and treated in [place].  He states that he took 2 weeks of clindamycin for infection prevention.  He denies any fever or drainage from the puncture site.  He rates pain as an 8 out of 10.         Past Medical History:  Diagnosis Date   Bursitis    left shoulder   Pneumothorax     There are no problems to display for this patient.   History reviewed. No pertinent surgical history.  Prior to Admission medications   Medication Sig Start Date End Date Taking? Authorizing Provider  HYDROcodone-acetaminophen (NORCO/VICODIN) 5-325 MG tablet Take 1 tablet by mouth every 6 (six) hours as needed for moderate pain. 12/17/20 12/17/21 Yes Amandine Covino L, PA-C  ibuprofen (ADVIL) 600 MG tablet Take 1 tablet (600 mg total) by mouth every 6 (six) hours as needed. 12/17/20  Yes Bridget Hartshorn L, PA-C  ibuprofen (ADVIL) 200 MG tablet Take 200 mg by mouth every 6 (six) hours as needed.    [provider]    Allergies Penicillins, Other, and Toradol [ketorolac tromethamine]  History reviewed. No pertinent family history.  Social History Social History   Tobacco Use   Smoking status: Every Day    Packs/day: 1.00    Types: Cigarettes   Smokeless tobacco: Former  Building services engineer Use: Every day  Substance Use Topics   Alcohol use: Yes   Drug use: No    Review of  Systems Constitutional: No fever/chills Eyes: No visual changes. Cardiovascular: Denies chest pain. Respiratory: Denies shortness of breath. Gastrointestinal:  No nausea, no vomiting.  No diarrhea.   Musculoskeletal: Positive right foot pain. Skin: Healing plantar wound. Neurological: Negative for headaches, focal weakness or numbness. ____________________________________________   PHYSICAL EXAM:  VITAL SIGNS: ED Triage Vitals  Enc Vitals Group     BP 12/17/20 0753 (!) 126/92     Pulse Rate 12/17/20 0753 72     Resp 12/17/20 0809 18     Temp 12/17/20 0753 98.1 F (36.7 C)     Temp src --      SpO2 12/17/20 0753 98 %     Weight 12/17/20 0746 155 lb (70.3 kg)     Height 12/17/20 0746 5\' 11"  (1.803 m)     Head Circumference --      Peak Flow --      Pain Score 12/17/20 0746 8     Pain Loc --      Pain Edu? --      Excl. in GC? --     Constitutional: Alert and oriented. Well appearing and in no acute distress. Eyes: Conjunctivae are normal.  Head: Atraumatic. Neck: No stridor.   Cardiovascular: Normal rate, regular rhythm. Grossly normal heart sounds.  Good peripheral circulation. Respiratory: Normal respiratory effort.  No retractions. Lungs CTAB. Musculoskeletal:  Right foot dorsal aspect there is moderate tenderness on palpation of the third and fourth mid metatarsal area.  Minimal soft tissue edema.  On inspection of the healing puncture wound there is no erythema or warmth.  No drainage is noted from the site and appears to be healing without any signs of infection.  Pulses present.  Motor sensory function intact distal to the injury. Neurologic:  Normal speech and language. No gross focal neurologic deficits are appreciated.  Gait is antalgic secondary to discomfort with weightbearing. Skin:  Skin is warm, dry. Psychiatric: Mood and affect are normal. Speech and behavior are normal.  ____________________________________________   LABS (all labs ordered are listed, but  only abnormal results are displayed)  Labs Reviewed - No data to display ____________________________________________  ___________________________________________  RADIOLOGY Beaulah Corin, personally viewed and evaluated these images (plain radiographs) as part of my medical decision making, as well as reviewing the written report by the radiologist.    Official radiology report(s): DG Foot Complete Right  Result Date: 12/17/2020 CLINICAL DATA:  Right foot pain after stepping on nail. EXAM: RIGHT FOOT COMPLETE - 3+ VIEW COMPARISON:  None. FINDINGS: There is a small cortical irregularity involving the proximal shaft of the third metatarsal concerning for cortical fracture. Mild callus formation is noted suggesting subacute fracture. No other bony abnormality is noted. Joint spaces are intact. IMPRESSION: Probable small cortical fracture is seen involving proximal shaft of third metatarsal with mild callus formation suggesting subacute fracture. Electronically Signed   By: Lupita Raider M.D.   On: 12/17/2020 09:06    ____________________________________________   PROCEDURES  Procedure(s) performed (including Critical Care):  Procedures   ____________________________________________   INITIAL IMPRESSION / ASSESSMENT AND PLAN / ED COURSE  As part of my medical decision making, I reviewed the following data within the electronic MEDICAL RECORD NUMBER Notes from prior ED visits and [place] Controlled Substance Database  45 year old male presents to the ED with complaint of right foot pain after feeling a pop in his foot a couple days ago.  Patient was also seen for a puncture wound to his foot several weeks ago and was treated with clindamycin for 2 weeks.  He denies any suspicion for infection.  X-rays did show a very small fracture to the third metatarsal midshaft with mild callus formation to suggest a subacute fracture.  Patient was placed in a postop shoe with instructions to  follow-up with Dr. Ether Griffins who is on-call for podiatry.  A prescription for ibuprofen 600 mg 3 times daily with food for inflammation and pain.  A prescription for hydrocodone every 6 hours for the next 2 days as needed for moderate to severe pain.  Patient is aware that he cannot drive or operate machinery while taking this medication.   ____________________________________________   FINAL CLINICAL IMPRESSION(S) / ED DIAGNOSES  Final diagnoses:  Closed nondisplaced fracture of third metatarsal bone of right foot, initial encounter     ED Discharge Orders          Ordered    ibuprofen (ADVIL) 600 MG tablet  Every 6 hours PRN        12/17/20 0937    HYDROcodone-acetaminophen (NORCO/VICODIN) 5-325 MG tablet  Every 6 hours PRN        12/17/20 6222             Note:  This document was prepared using Dragon voice recognition software and may include unintentional dictation errors.    Tommi Rumps,  PA-C 12/17/20 1012    Arnaldo Natal, MD 12/17/20 1626

## 2021-02-27 ENCOUNTER — Emergency Department: Payer: Self-pay

## 2021-02-27 ENCOUNTER — Emergency Department
Admission: EM | Admit: 2021-02-27 | Discharge: 2021-02-27 | Disposition: A | Discharge status: Home / Self Care | Payer: Self-pay | Attending: Emergency Medicine | Admitting: Emergency Medicine

## 2021-02-27 ENCOUNTER — Ambulatory Visit
Admission: EM | Admit: 2021-02-27 | Discharge: 2021-02-27 | Disposition: A | Discharge status: Home / Self Care | Payer: Self-pay | Attending: Emergency Medicine | Admitting: Emergency Medicine

## 2021-02-27 ENCOUNTER — Other Ambulatory Visit: Payer: Self-pay

## 2021-02-27 DIAGNOSIS — R079 Chest pain, unspecified: Secondary | ICD-10-CM

## 2021-02-27 DIAGNOSIS — R0789 Other chest pain: Secondary | ICD-10-CM

## 2021-02-27 DIAGNOSIS — J40 Bronchitis, not specified as acute or chronic: Secondary | ICD-10-CM | POA: Insufficient documentation

## 2021-02-27 DIAGNOSIS — Z20822 Contact with and (suspected) exposure to covid-19: Secondary | ICD-10-CM | POA: Insufficient documentation

## 2021-02-27 DIAGNOSIS — R0602 Shortness of breath: Secondary | ICD-10-CM

## 2021-02-27 LAB — BASIC METABOLIC PANEL
Anion gap: 11 (ref 5–15)
BUN: 17 mg/dL (ref 6–20)
CO2: 24 mmol/L (ref 22–32)
Calcium: 9.5 mg/dL (ref 8.9–10.3)
Chloride: 100 mmol/L (ref 98–111)
Creatinine, Ser: 0.8 mg/dL (ref 0.61–1.24)
GFR, Estimated: >60 mL/min (ref >60)
Glucose, Bld: 103 mg/dL — ABNORMAL HIGH (ref 70–99)
Potassium: 3.8 mmol/L (ref 3.5–5.1)
Sodium: 135 mmol/L (ref 135–145)

## 2021-02-27 LAB — D-DIMER, QUANTITATIVE: D-Dimer, Quant: <0.27 ug/mL-FEU (ref 0.00–0.50)

## 2021-02-27 LAB — CBC
HCT: 39.6 % (ref 39.0–52.0)
Hemoglobin: 13.5 g/dL (ref 13.0–17.0)
MCH: 31.2 pg (ref 26.0–34.0)
MCHC: 34.1 g/dL (ref 30.0–36.0)
MCV: 91.5 fL (ref 80.0–100.0)
Platelets: 297 10*3/uL (ref 150–400)
RBC: 4.33 MIL/uL (ref 4.22–5.81)
RDW: 13.5 % (ref 11.5–15.5)
WBC: 9.7 10*3/uL (ref 4.0–10.5)
nRBC: 0 % (ref 0.0–0.2)

## 2021-02-27 LAB — TROPONIN I (HIGH SENSITIVITY)
Troponin I (High Sensitivity): 6 ng/L (ref <18)
Troponin I (High Sensitivity): 5 ng/L (ref <18)

## 2021-02-27 LAB — RESP PANEL BY RT-PCR (FLU A&B, COVID) ARPGX2
Influenza A by PCR: NEGATIVE
Influenza B by PCR: NEGATIVE
SARS Coronavirus 2 by RT PCR: NEGATIVE

## 2021-02-27 MED ORDER — PREDNISONE 20 MG PO TABS: 40.0000 mg | ORAL_TABLET | Freq: Every day | ORAL | 0 refills | Status: AC

## 2021-02-27 MED ORDER — ACETAMINOPHEN 500 MG PO TABS
1000.0000 mg | ORAL_TABLET | Freq: Once | ORAL | Status: AC
  Administered 2021-02-27: 1000 mg via ORAL
  Filled 2021-02-27: qty 2

## 2021-02-27 MED ORDER — LIDOCAINE 5 % EX PTCH
1.0000 | MEDICATED_PATCH | CUTANEOUS | Status: DC
  Administered 2021-02-27: 1 via TRANSDERMAL
  Filled 2021-02-27: qty 1

## 2021-02-27 MED ORDER — IPRATROPIUM-ALBUTEROL 0.5-2.5 (3) MG/3ML IN SOLN
3.0000 mL | Freq: Once | RESPIRATORY_TRACT | Status: AC
  Administered 2021-02-27: 3 mL via RESPIRATORY_TRACT
  Filled 2021-02-27: qty 3

## 2021-02-27 MED ORDER — ALBUTEROL SULFATE HFA 108 (90 BASE) MCG/ACT IN AERS: 2.0000 | INHALATION_SPRAY | Freq: Four times a day (QID) | RESPIRATORY_TRACT | 0 refills | Status: DC | PRN

## 2021-02-27 NOTE — Discharge Instructions (Addendum)
Try to cut down on your smoking as this can lead to issues with your lungs.  Call the pulmonary number to make a follow-up appointment because your x-ray is already showing signs of lung damage from your smoking.  You can also call the cardiologist to make a follow-up appointment due to your risk factors but you have no evidence of a heart attack today   IMPRESSION: COPD. There are no signs of pulmonary edema or focal pulmonary consolidation.

## 2021-02-27 NOTE — ED Notes (Signed)
Patient is being discharged from the Urgent Care and sent to the Emergency Department via POV . Per White, NP, patient is in need of higher level of care due to chest pain and SOB. Patient is aware and verbalizes understanding of plan of care.  Vitals:   02/27/21 1657  BP: (!) 125/98  Pulse: 85  Resp: 16  Temp: 98.4 F (36.9 C)  SpO2: 100%

## 2021-02-27 NOTE — ED Notes (Signed)
Pt left prior to receiving AVS. MD went over discharge instructions with pt prior to him leaving.

## 2021-02-27 NOTE — ED Triage Notes (Signed)
Patient presents to Urgent Care with complaints of chest pain and SOB x 2 days. He states pain starts at the center of chest and radiates to left upper shoulder area. He reports laying flat helps alleviate the pain a little. Pt states his mother passed away a week ago and so being dealing with a lot of stress. He also hx of pneumothorax so the chest pain was concerning so came in for eval.

## 2021-02-27 NOTE — ED Triage Notes (Signed)
Pt to ED via POV from home. Pt c/o central CP that radiates to the left. Pt states mother recently passed and has been stressed. Pt also states he has hx pneumothorax. Pt also states SOB with exertion. Pt current smoker. Lung sounds equal bilaterally.

## 2021-02-27 NOTE — ED Provider Notes (Signed)
[facility]    CSN: [id number] Arrival date & time: 02/27/21  1651      History   Chief Complaint Chief Complaint  Patient presents with   Chest Pain   Shortness of Breath    HPI Frederick Mendoza is a 46 y.o. male.   Patient presents with intermittent centralized chest pain for the last 2 to 3 days.  Endorses that this morning pain became constant, worsening as the day has progressed.  Pain intermittently radiates to the left shoulder described as sharp and shooting.  Associated shortness of breath.  Has not attempted treatment of symptoms.  Endorses increased stress due to his mother's recent passing.  Denies blurred vision, dizziness, lightheadedness, syncope, abdominal pain.  History of pneumothorax.  Smoker.  Denies cardiac history.   Past Medical History:  Diagnosis Date   Bursitis    left shoulder   Pneumothorax     There are no problems to display for this patient.   History reviewed. No pertinent surgical history.     Home Medications    Prior to Admission medications   Medication Sig Start Date End Date Taking? Authorizing Provider  HYDROcodone-acetaminophen (NORCO/VICODIN) 5-325 MG tablet Take 1 tablet by mouth every 6 (six) hours as needed for moderate pain. 12/17/20 12/17/21  Johnn Hai, PA-C  ibuprofen (ADVIL) 200 MG tablet Take 200 mg by mouth every 6 (six) hours as needed.    [provider]  ibuprofen (ADVIL) 600 MG tablet Take 1 tablet (600 mg total) by mouth every 6 (six) hours as needed. 12/17/20   Johnn Hai, PA-C    Family History History reviewed. No pertinent family history.  Social History Social History   Tobacco Use   Smoking status: Every Day    Packs/day: 1.00    Types: Cigarettes   Smokeless tobacco: Former  Scientific laboratory technician Use: Every day  Substance Use Topics   Alcohol use: Yes   Drug use: No     Allergies   Penicillins, Other, and Toradol [ketorolac tromethamine]   Review of  Systems Review of Systems  Respiratory:  Positive for shortness of breath.   Cardiovascular:  Positive for chest pain.    Physical Exam Triage Vital Signs ED Triage Vitals [02/27/21 1657]  Enc Vitals Group     BP (!) 125/98     Pulse Rate 85     Resp 16     Temp 98.4 F (36.9 C)     Temp Source Oral     SpO2 100 %     Weight      Height      Head Circumference      Peak Flow      Pain Score 7     Pain Loc      Pain Edu?      Excl. in [place]?    No data found.  Updated Vital Signs BP (!) 125/98 (BP Location: Left Arm)    Pulse 85    Temp 98.4 F (36.9 C) (Oral)    Resp 16    SpO2 100%   Visual Acuity Right Eye Distance:   Left Eye Distance:   Bilateral Distance:    Right Eye Near:   Left Eye Near:    Bilateral Near:     Physical Exam   UC Treatments / Results  Labs (all labs ordered are listed, but only abnormal results are displayed) Labs Reviewed - No data to display  EKG  Radiology No results found.  Procedures Procedures (including critical care time)  Medications Ordered in UC Medications - No data to display  Initial Impression / Assessment and Plan / UC Course  I have reviewed the triage vital signs and the nursing notes.  Pertinent labs & imaging results that were available during my care of the patient were reviewed by me and considered in my medical decision making (see chart for details).  Chest pain Shortness of breath  Patient sent to the nearest emergency department for further evaluation of chest pain with associated shortness of breath to rule out cardiac involvement as well as a pneumothorax.  EKG showing normal sinus rhythm, vital signs are stable, patient stable to escort self.  Patient endorses that he will go to [facility] for treatment. Final Clinical Impressions(s) / UC Diagnoses   Final diagnoses:  None   Discharge Instructions   None    ED Prescriptions   None    PDMP not reviewed this encounter.   Hans Eden, NP 02/27/21 1718

## 2021-02-27 NOTE — ED Provider Notes (Signed)
[facility] Provider Note    Event Date/Time   First MD Initiated Contact with Patient 02/27/21 1918     (approximate)   History   Chest Pain   HPI  Frederick Mendoza is a 46 y.o. male with history of pneumothorax who comes in with concerns for chest pain.  Patient's had some intermittent chest pain for the past 2 to 3 days patient was seen over at urgent care and sent to the ER for further evaluation.  Patient reports the pain is pretty constant, left side of his chest, sharp but also pressure-like with some associated shortness of breath.  Patient does report smoking, no history of MI or PE but does report that his mom just recently died and so he has a lot of stress in regards to that.  Denies any abdominal pain.    Physical Exam   Triage Vital Signs: ED Triage Vitals  Enc Vitals Group     BP 02/27/21 1758 118/90     Pulse Rate 02/27/21 1758 72     Resp 02/27/21 1758 20     Temp 02/27/21 1758 98.5 F (36.9 C)     Temp src --      SpO2 02/27/21 1758 100 %     Weight 02/27/21 1755 150 lb (68 kg)     Height 02/27/21 1755 5\' 11"  (1.803 m)     Head Circumference --      Peak Flow --      Pain Score 02/27/21 1755 7     Pain Loc --      Pain Edu? --      Excl. in [place]? --     Most recent vital signs: Vitals:   02/27/21 1758 02/27/21 1910  BP: 118/90 133/83  Pulse: 72 72  Resp: 20 11  Temp: 98.5 F (36.9 C)   SpO2: 100% 99%     General: Awake, no distress.  CV:  Good peripheral perfusion. No rash. No chest wall tenderness  Resp:  Normal effort. Clear lungs Abd:  No distention. Soft non tender  Other:  Equal strength in arms and legs   ED Results / Procedures / Treatments   Labs (all labs ordered are listed, but only abnormal results are displayed) Labs Reviewed  BASIC METABOLIC PANEL - Abnormal; Notable for the following components:      Result Value   Glucose, Bld 103 (*)    All other components within normal limits  RESP PANEL BY  RT-PCR (FLU A&B, COVID) ARPGX2  CBC  D-DIMER, QUANTITATIVE  TROPONIN I (HIGH SENSITIVITY)  TROPONIN I (HIGH SENSITIVITY)     EKG  My interpretation of EKG:  Normal sinus rate of 73 with diffuse minimal ST elevation, T wave version in lead III, normal intervals.  EKG looks similar ST elevation but the T wave version lead III is new  RADIOLOGY I have reviewed the xray personally I do not see evidence of pulmonary edema or pneumothorax    PROCEDURES:  Critical Care performed: No  .1-3 Lead EKG Interpretation Performed by: Vanessa [place], MD Authorized by: Vanessa [place], MD     Interpretation: normal     ECG rate:  65   ECG rate assessment: normal     Rhythm: sinus rhythm     Ectopy: none     Conduction: normal     MEDICATIONS ORDERED IN ED: Medications  lidocaine (LIDODERM) 5 % 1 patch (1 patch Transdermal Patch Applied 02/27/21  2029)  ipratropium-albuterol (DUONEB) 0.5-2.5 (3) MG/3ML nebulizer solution 3 mL (3 mLs Nebulization Given 02/27/21 2038)  acetaminophen (TYLENOL) tablet 1,000 mg (1,000 mg Oral Given 02/27/21 2025)     IMPRESSION / MDM / ASSESSMENT AND PLAN / ED COURSE  I reviewed the triage vital signs and the nursing notes.  Differential diagnosis includes, but is not limited to, chest pain and shortness of breath potentially from ACS, arrhythmia, PE, smoking/bronchitis.  No abdominal pain to suggest abdominal pathology.  His symptoms do not sound like pericarditis.  He does report a lot of stress and recent loss of his mom he has no significant wheezing on exam but he does have a significant smoking history and his chest x-ray had some findings that are consistent with COPD.   Initial troponin is 6 BMP normal CBC normal  Patient reports resolution of symptoms after breathing treatment, Tylenol, will Lidoderm patch  I suspect patient could be having some new onset COPD, bronchitis we will start some steroids, albuterol inhaler given pulmonary and cardiology  numbers for follow-up.  I considered admission for chest pain given his EKG did have a little bit of diffuse ST elevation but given his cardiac markers have been negative x2 his D-dimer is negative therefore very low suspicion for PE and patient not hypoxic and feeling much better after resolution of symptoms I think it is reasonable for patient to go home and patient feels comfortable with this plan  The patient is on the cardiac monitor to evaluate for evidence of arrhythmia and/or significant heart rate changes      FINAL CLINICAL IMPRESSION(S) / ED DIAGNOSES   Final diagnoses:  Bronchitis  Atypical chest pain     Rx / DC Orders   ED Discharge Orders          Ordered    albuterol (VENTOLIN HFA) 108 (90 Base) MCG/ACT inhaler  Every 6 hours PRN        02/27/21 2230    predniSONE (DELTASONE) 20 MG tablet  Daily with breakfast        02/27/21 2230             Note:  This document was prepared using Dragon voice recognition software and may include unintentional dictation errors.   Vanessa [place], MD 02/27/21 2231

## 2021-08-19 ENCOUNTER — Emergency Department
Admission: EM | Admit: 2021-08-19 | Discharge: 2021-08-19 | Discharge status: Against Medical Advice | Payer: Self-pay | Source: Home / Self Care

## 2022-06-13 IMAGING — DX DG FOOT COMPLETE 3+V*R*
3 series · 3 of 3 positions shown · non-contrast
Comparison: None.

CLINICAL DATA: Right foot pain after stepping on nail.

EXAM:
RIGHT FOOT COMPLETE - 3+ VIEW

[foot ap]
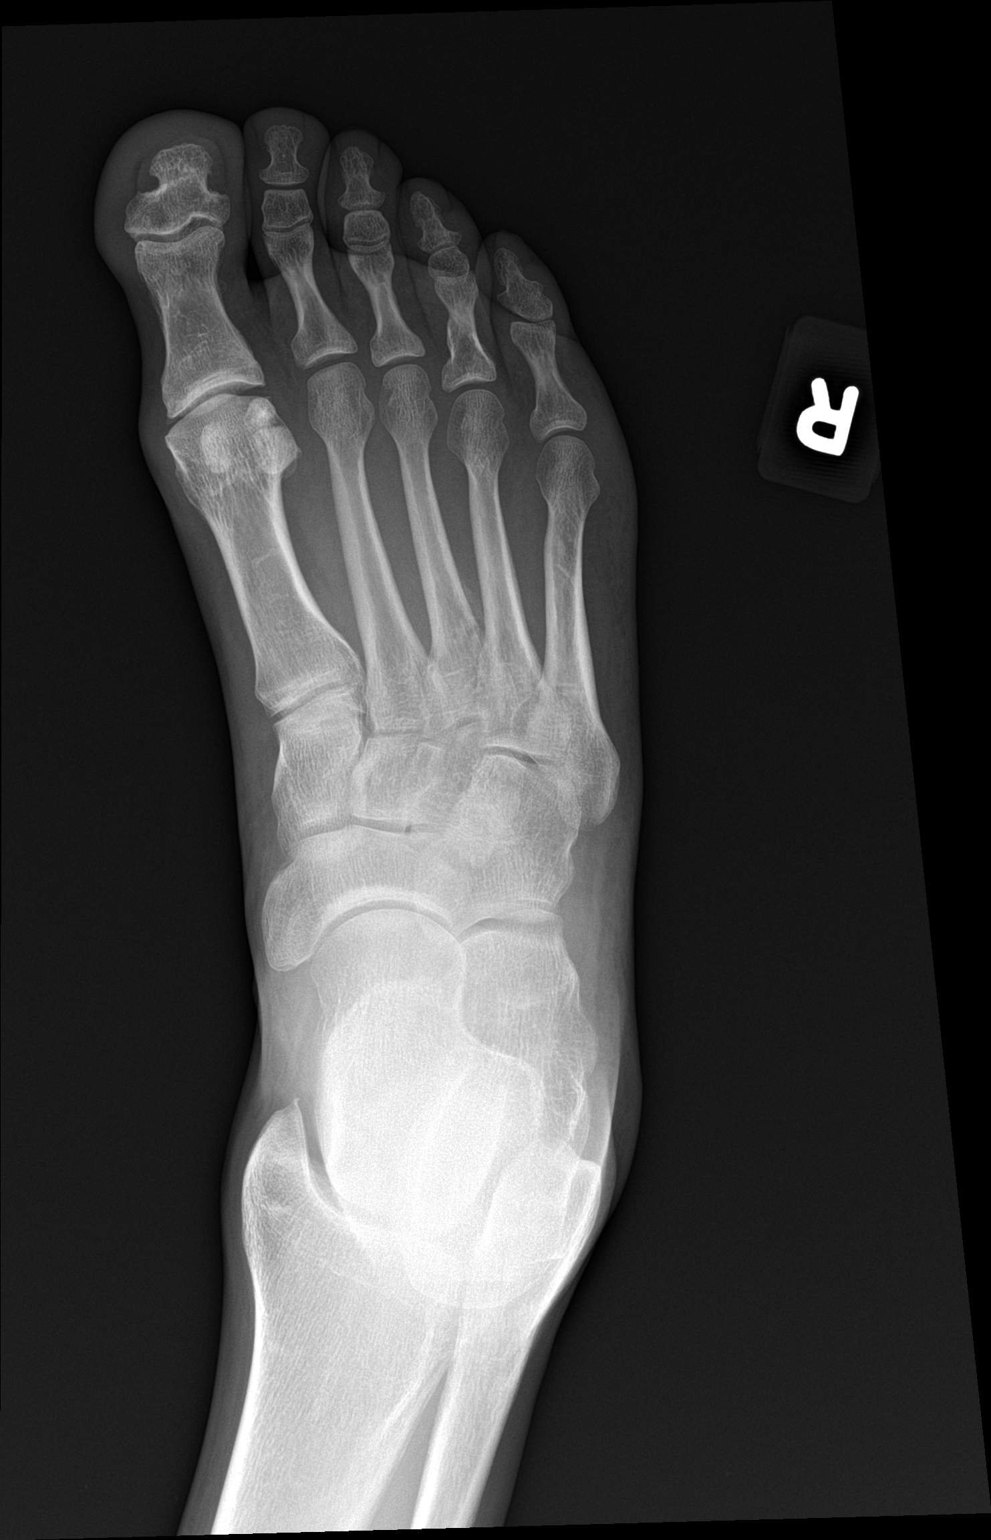

[foot obl]
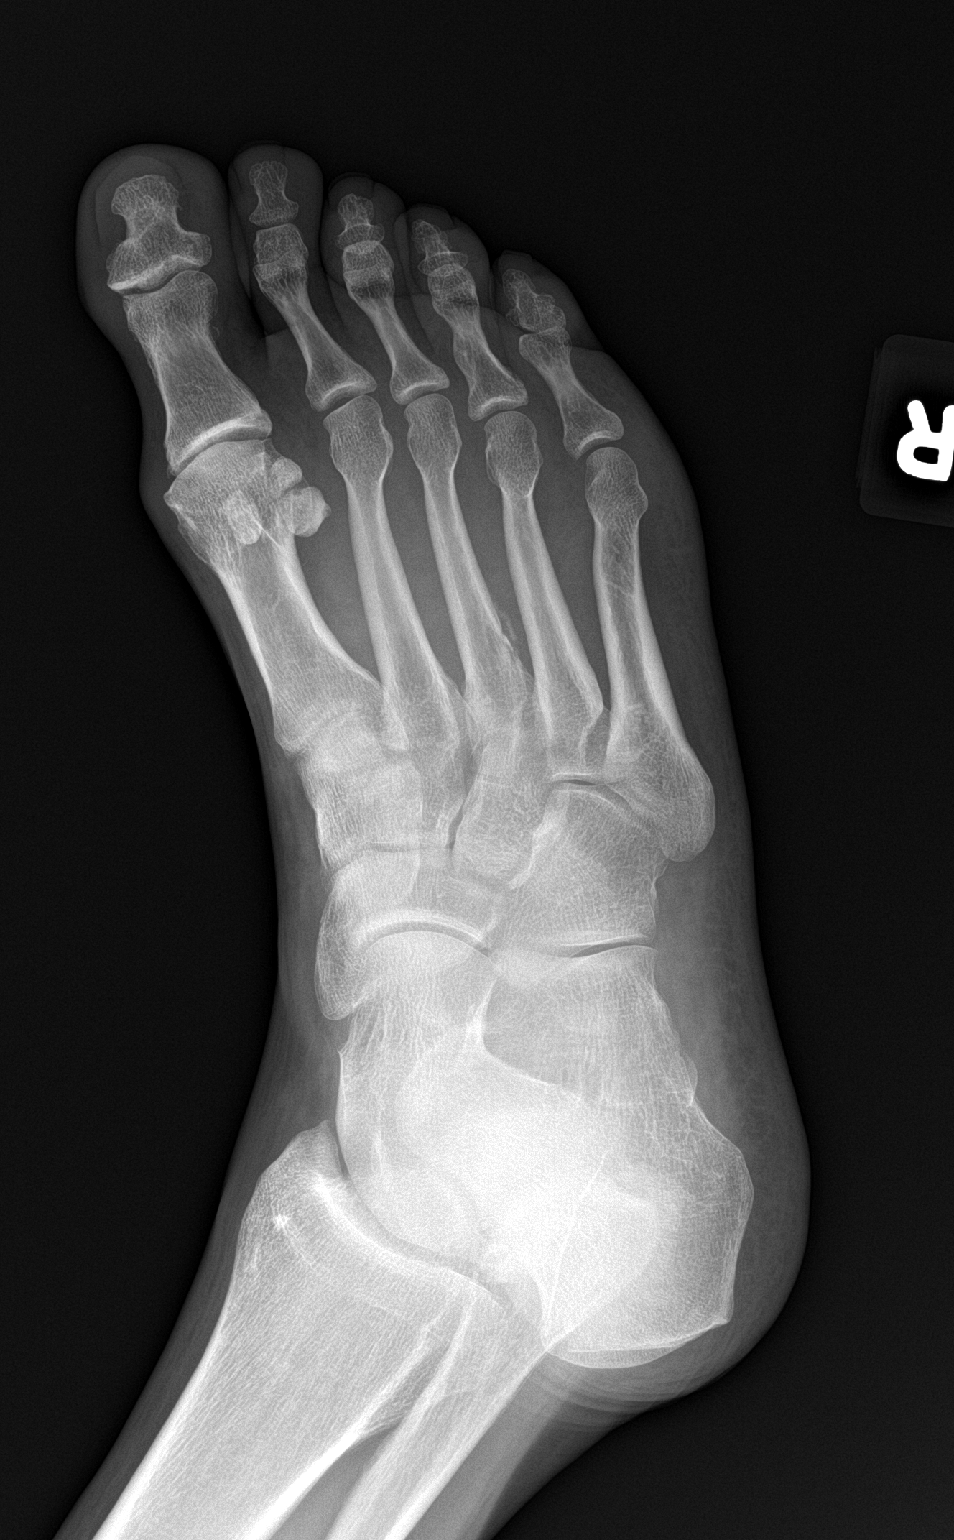

[foot lat]
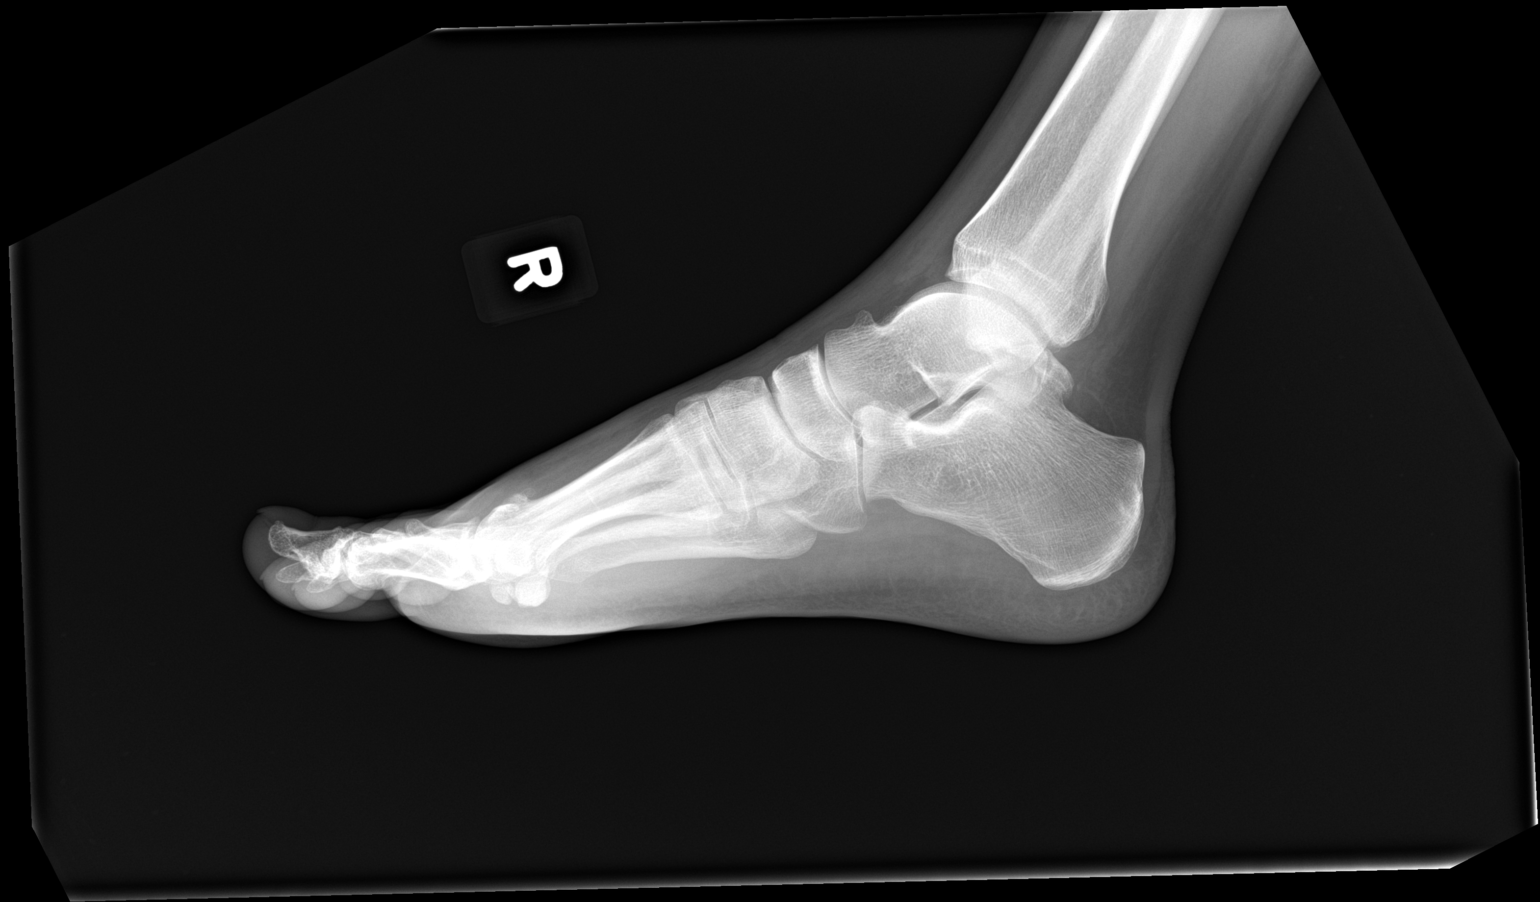

[3 of 3 positions shown; findings below may reference images not displayed]

FINDINGS: There is a small cortical irregularity involving the proximal shaft
of the third metatarsal concerning for cortical fracture. Mild
callus formation is noted suggesting subacute fracture. No other
bony abnormality is noted. Joint spaces are intact.
IMPRESSION: Probable small cortical fracture is seen involving proximal shaft of
third metatarsal with mild callus formation suggesting subacute
fracture.

## 2022-07-21 ENCOUNTER — Encounter: Payer: Self-pay | Admitting: Emergency Medicine

## 2022-07-21 ENCOUNTER — Ambulatory Visit (INDEPENDENT_AMBULATORY_CARE_PROVIDER_SITE_OTHER): Payer: 59

## 2022-07-21 ENCOUNTER — Ambulatory Visit
Admission: EM | Admit: 2022-07-21 | Discharge: 2022-07-21 | Disposition: A | Discharge status: Home / Self Care | Payer: 59 | Attending: Emergency Medicine | Admitting: Emergency Medicine

## 2022-07-21 DIAGNOSIS — J069 Acute upper respiratory infection, unspecified: Secondary | ICD-10-CM | POA: Insufficient documentation

## 2022-07-21 DIAGNOSIS — R509 Fever, unspecified: Secondary | ICD-10-CM

## 2022-07-21 LAB — GROUP A STREP BY PCR: Group A Strep by PCR: NOT DETECTED

## 2022-07-21 MED ORDER — BENZONATATE 100 MG PO CAPS: 200.0000 mg | ORAL_CAPSULE | Freq: Three times a day (TID) | ORAL | 0 refills | Status: DC

## 2022-07-21 MED ORDER — PROMETHAZINE-DM 6.25-15 MG/5ML PO SYRP: 5.0000 mL | ORAL_SOLUTION | Freq: Four times a day (QID) | ORAL | 0 refills | Status: DC | PRN

## 2022-07-21 MED ORDER — IPRATROPIUM BROMIDE 0.06 % NA SOLN: 2.0000 | Freq: Four times a day (QID) | NASAL | 12 refills | Status: DC

## 2022-07-21 MED ORDER — AEROCHAMBER MV MISC: 2 refills | Status: AC

## 2022-07-21 MED ORDER — ALBUTEROL SULFATE HFA 108 (90 BASE) MCG/ACT IN AERS: 2.0000 | INHALATION_SPRAY | RESPIRATORY_TRACT | 0 refills | Status: AC | PRN

## 2022-07-21 NOTE — ED Triage Notes (Signed)
Pt c/o cough, sore throat, nasal congestion, headache, weakness, subjective fever and body aches. Started about 4 days ago.

## 2022-07-21 NOTE — ED Provider Notes (Signed)
[facility]    CSN: [id number] Arrival date & time: 07/21/22  4098      History   Chief Complaint Chief Complaint  Patient presents with   Sore Throat   Cough    HPI Frederick Mendoza is a 47 y.o. male.   HPI  47 year old male with a past medical history significant for pneumothorax and bursitis presents for evaluation of 4 days worth of respiratory symptoms.  She is reporting a subjective fever, headache, body aches, nasal congestion, sore throat, and productive cough with shortness of breath and wheezing.  He is also had some diarrhea.  He denies any nausea or vomiting or recent travel.  His son has similar symptoms that predate his onset by 3 days.  Past Medical History:  Diagnosis Date   Bursitis    left shoulder   Pneumothorax     There are no problems to display for this patient.   History reviewed. No pertinent surgical history.     Home Medications    Prior to Admission medications   Medication Sig Start Date End Date Taking? Authorizing Provider  albuterol (VENTOLIN HFA) 108 (90 Base) MCG/ACT inhaler Inhale 2 puffs into the lungs every 4 (four) hours as needed. 07/21/22  Yes Becky Augusta, NP  benzonatate (TESSALON) 100 MG capsule Take 2 capsules (200 mg total) by mouth every 8 (eight) hours. 07/21/22  Yes Becky Augusta, NP  ipratropium (ATROVENT) 0.06 % nasal spray Place 2 sprays into both nostrils 4 (four) times daily. 07/21/22  Yes Becky Augusta, NP  promethazine-dextromethorphan (PROMETHAZINE-DM) 6.25-15 MG/5ML syrup Take 5 mLs by mouth 4 (four) times daily as needed. 07/21/22  Yes Becky Augusta, NP  Spacer/Aero-Holding Chambers (AEROCHAMBER MV) inhaler Use as instructed 07/21/22  Yes Becky Augusta, NP    Family History History reviewed. No pertinent family history.  Social History Social History   Tobacco Use   Smoking status: Every Day    Current packs/day: 1.00    Types: Cigarettes   Smokeless tobacco: Former  Building services engineer status:  Every Day  Substance Use Topics   Alcohol use: Yes   Drug use: No     Allergies   Penicillins, Other, and Toradol [ketorolac tromethamine]   Review of Systems Review of Systems  Constitutional:  Positive for fatigue and fever.  HENT:  Positive for congestion, rhinorrhea and sore throat. Negative for ear pain.   Respiratory:  Positive for cough, shortness of breath and wheezing.   Gastrointestinal:  Positive for diarrhea. Negative for nausea and vomiting.  Musculoskeletal:  Positive for arthralgias and myalgias.  Neurological:  Positive for headaches.     Physical Exam Triage Vital Signs ED Triage Vitals  Encounter Vitals Group     BP      Systolic BP Percentile      Diastolic BP Percentile      Pulse      Resp      Temp      Temp src      SpO2      Weight      Height      Head Circumference      Peak Flow      Pain Score      Pain Loc      Pain Education      Exclude from Growth Chart    No data found.  Updated Vital Signs BP 131/85 (BP Location: Right Arm)   Pulse 68   Temp 98.8 F (  37.1 C) (Oral)   Resp 16   Ht 5\' 11"  (1.803 m)   Wt 149 lb 14.6 oz (68 kg)   SpO2 98%   BMI 20.91 kg/m   Visual Acuity Right Eye Distance:   Left Eye Distance:   Bilateral Distance:    Right Eye Near:   Left Eye Near:    Bilateral Near:     Physical Exam Vitals and nursing note reviewed.  Constitutional:      Appearance: Normal appearance. He is ill-appearing.  HENT:     Head: Normocephalic and atraumatic.     Right Ear: Tympanic membrane, ear canal and external ear normal. There is no impacted cerumen.     Left Ear: Tympanic membrane, ear canal and external ear normal. There is no impacted cerumen.     Nose: Congestion and rhinorrhea present.     Comments: His mucosa is erythematous and edematous with scant clear discharge in both nares.    Mouth/Throat:     Mouth: Mucous membranes are moist.     Pharynx: Oropharynx is clear. Posterior oropharyngeal erythema  present. No oropharyngeal exudate.     Comments: Soft palate, bilateral tonsillar pillars, posterior oropharynx all erythematous and injected.  No exudate. Neck:     Comments: Bilateral, tender, anterior cervical lymphadenopathy. Cardiovascular:     Rate and Rhythm: Normal rate and regular rhythm.     Pulses: Normal pulses.     Heart sounds: Normal heart sounds. No murmur heard.    No friction rub. No gallop.  Pulmonary:     Effort: Pulmonary effort is normal.     Breath sounds: Wheezing and rhonchi present. No rales.     Comments: Patient has wheezes and rhonchi in bilateral upper lobes.  Remainder of lung fields are clear to auscultation. Musculoskeletal:     Cervical back: Normal range of motion and neck supple. Tenderness present.  Lymphadenopathy:     Cervical: Cervical adenopathy present.  Skin:    General: Skin is warm and dry.     Capillary Refill: Capillary refill takes less than 2 seconds.  Neurological:     General: No focal deficit present.     Mental Status: He is alert and oriented to person, place, and time.      UC Treatments / Results  Labs (all labs ordered are listed, but only abnormal results are displayed) Labs Reviewed  GROUP A STREP BY PCR    EKG   Radiology DG Chest 2 View  Result Date: 07/21/2022 CLINICAL DATA:  47 year old male with cough sore throat nasal congestion headache weakness and fever x4 days. EXAM: CHEST - 2 VIEW COMPARISON:  Chest radiographs 02/27/2021 and earlier. FINDINGS: Chronic pulmonary hyperinflation suspected, stable large lung volumes. Normal cardiac size and mediastinal contours. Visualized tracheal air column is within normal limits. Lung markings remain within normal limits. Both lungs appear clear. No pleural effusion. Subtle dextroconvex thoracic scoliosis. No acute osseous abnormality identified. Negative visible bowel gas. IMPRESSION: Evidence of chronic pulmonary hyperinflation. No acute cardiopulmonary abnormality.  Electronically Signed   By: Odessa Fleming M.D.   On: 07/21/2022 08:42    Procedures Procedures (including critical care time)  Medications Ordered in UC Medications - No data to display  Initial Impression / Assessment and Plan / UC Course  I have reviewed the triage vital signs and the nursing notes.  Pertinent labs & imaging results that were available during my care of the patient were reviewed by me and considered in my medical decision  making (see chart for details).   Patient is a nontoxic, though mildly ill-appearing, 47 year old male presenting for evaluation of 4 days with respiratory symptoms as outlined in HPI above.  He is afebrile in clinic with an oral temp of 98, respiratory 16, SpO2 on room air 98%.  He is able to speak in full sentence without dyspnea or tachypnea.  He states that he feels a lot of congestion in the middle of his chest, especially for single morning, and after coughing forcefully he is able to bring up some greenish sputum.  Cardiopulmonary exam reveals wheezes and rhonchi in bilateral upper lobes but the remainder the lung fields are clear to auscultation.  He does have a history of bronchitis.  I will obtain a chest x-ray to rule out pneumonia.  Given that he is also experiencing a sore throat with tender anterior cervical lymphadenopathy and erythematous tonsillar pillars and soft palate I will also order a strep PCR.  Chest x-ray independently reviewed and evaluated by me.  Impression: Lung fields are clear but there is blunting of bilateral costophrenic angles with questionable mild pleural effusions present.  Radiology overread is pending. Radiology impression states there is evidence of chronic pulmonary hyperinflation but no acute cardiopulmonary abnormality.  No pleural effusions noted.  Strep PCR is negative.  I will discharge patient on the diagnosis of viral URI with a cough.  I will prescribe an albuterol inhaler that he can use every 4-6 hours as needed  for cough, shortness breath, and wheezing.  Atrovent nasal spray for nasal congestion along with Tessalon Perles and Promethazine DM cough syrup for cough and congestion.  Over-the-counter Tylenol and/or ibuprofen as needed for pain or fever.  Work note provided.   Final Clinical Impressions(s) / UC Diagnoses   Final diagnoses:  Viral URI with cough     Discharge Instructions      Your chest x-ray today did not show any evidence of pneumonia though it does show hyperinflation of your lungs.  This mean that area is getting in but not fully getting out.  This could be accounting for the congestion you are feeling as well as the shortness breath and wheezing.  This could be early onset COPD in the setting if you are a smoker.  Use the albuterol inhaler with a spacer, 2 puffs every 4-6 hours, as needed for shortness of breath, wheezing, cough.  Use over-the-counter Tylenol and or ibuprofen according to package instructions as needed for fever or pain.  Use the Atrovent nasal spray, 2 squirts in each nostril every 6 hours, as needed for runny nose and postnasal drip.  Use the Tessalon Perles every 8 hours during the day.  Take them with a small sip of water.  They may give you some numbness to the base of your tongue or a metallic taste in your mouth, this is normal.  Use the Promethazine DM cough syrup at bedtime for cough and congestion.  It will make you drowsy so do not take it during the day.  Return for reevaluation or see your primary care provider for any new or worsening symptoms.      ED Prescriptions     Medication Sig Dispense Auth. Provider   albuterol (VENTOLIN HFA) 108 (90 Base) MCG/ACT inhaler Inhale 2 puffs into the lungs every 4 (four) hours as needed. 18 g Becky Augusta, NP   Spacer/Aero-Holding Chambers (AEROCHAMBER MV) inhaler Use as instructed 1 each Becky Augusta, NP   benzonatate (TESSALON) 100 MG capsule  Take 2 capsules (200 mg total) by mouth every 8 (eight)  hours. 21 capsule Becky Augusta, NP   ipratropium (ATROVENT) 0.06 % nasal spray Place 2 sprays into both nostrils 4 (four) times daily. 15 mL Becky Augusta, NP   promethazine-dextromethorphan (PROMETHAZINE-DM) 6.25-15 MG/5ML syrup Take 5 mLs by mouth 4 (four) times daily as needed. 118 mL Becky Augusta, NP      PDMP not reviewed this encounter.   Becky Augusta, NP 07/21/22 [phone]

## 2022-07-21 NOTE — Discharge Instructions (Addendum)
Your chest x-ray today did not show any evidence of pneumonia though it does show hyperinflation of your lungs.  This mean that area is getting in but not fully getting out.  This could be accounting for the congestion you are feeling as well as the shortness breath and wheezing.  This could be early onset COPD in the setting if you are a smoker.  Use the albuterol inhaler with a spacer, 2 puffs every 4-6 hours, as needed for shortness of breath, wheezing, cough.  Use over-the-counter Tylenol and or ibuprofen according to package instructions as needed for fever or pain.  Use the Atrovent nasal spray, 2 squirts in each nostril every 6 hours, as needed for runny nose and postnasal drip.  Use the Tessalon Perles every 8 hours during the day.  Take them with a small sip of water.  They may give you some numbness to the base of your tongue or a metallic taste in your mouth, this is normal.  Use the Promethazine DM cough syrup at bedtime for cough and congestion.  It will make you drowsy so do not take it during the day.  Return for reevaluation or see your primary care provider for any new or worsening symptoms.

## 2022-07-23 ENCOUNTER — Other Ambulatory Visit: Payer: Self-pay

## 2022-07-23 ENCOUNTER — Encounter: Payer: Self-pay | Admitting: Emergency Medicine

## 2022-07-23 ENCOUNTER — Emergency Department: Payer: 59

## 2022-07-23 ENCOUNTER — Emergency Department
Admission: EM | Admit: 2022-07-23 | Discharge: 2022-07-23 | Disposition: A | Discharge status: Home / Self Care | Payer: 59 | Attending: Emergency Medicine | Admitting: Emergency Medicine

## 2022-07-23 DIAGNOSIS — Z1152 Encounter for screening for COVID-19: Secondary | ICD-10-CM | POA: Insufficient documentation

## 2022-07-23 DIAGNOSIS — J069 Acute upper respiratory infection, unspecified: Secondary | ICD-10-CM | POA: Insufficient documentation

## 2022-07-23 LAB — RESP PANEL BY RT-PCR (RSV, FLU A&B, COVID)  RVPGX2
Influenza A by PCR: NEGATIVE
Influenza B by PCR: NEGATIVE
Resp Syncytial Virus by PCR: NEGATIVE
SARS Coronavirus 2 by RT PCR: NEGATIVE

## 2022-07-23 MED ORDER — AZITHROMYCIN 250 MG PO TABS: ORAL_TABLET | ORAL | 0 refills | Status: DC

## 2022-07-23 NOTE — ED Provider Notes (Signed)
[facility] Provider Note    Event Date/Time   First MD Initiated Contact with Patient 07/23/22 0830     (approximate)   History   URI   HPI  TORSTEN WENIGER is a 47 y.o. male with history of pneumothorax, bursitis, smoking and as listed in EMR presents to the emergency department for treatment and evaluation of persistent and productive cough with intermittent fever and chills for the past 6 or 7 days.  He was evaluated at urgent care 2 days ago but reports he was not tested for COVID or influenza.  He states that his strep screen there was negative.  He also had a chest x-ray that day as well that was negative.  He states that symptoms are worsening and he is having pain in the upper chest with productive green sputum.  He also has had intermittent fever and chills.      Physical Exam   Triage Vital Signs: ED Triage Vitals  Encounter Vitals Group     BP 07/23/22 0817 (!) 148/105     Systolic BP Percentile --      Diastolic BP Percentile --      Pulse Rate 07/23/22 0817 83     Resp 07/23/22 0817 18     Temp 07/23/22 0817 98.8 F (37.1 C)     Temp Source 07/23/22 0817 Oral     SpO2 07/23/22 0817 98 %     Weight 07/23/22 0832 149 lb 14.6 oz (68 kg)     Height 07/23/22 0832 5\' 11"  (1.803 m)     Head Circumference --      Peak Flow --      Pain Score 07/23/22 0817 6     Pain Loc --      Pain Education --      Exclude from Growth Chart --     Most recent vital signs: Vitals:   07/23/22 0817  BP: (!) 148/105  Pulse: 83  Resp: 18  Temp: 98.8 F (37.1 C)  SpO2: 98%    General: Awake, no distress.  CV:  Good peripheral perfusion.  Resp:  Normal effort. Diminished breath sounds throughout. Abd:  No distention.  Other:     ED Results / Procedures / Treatments   Labs (all labs ordered are listed, but only abnormal results are displayed) Labs Reviewed  RESP PANEL BY RT-PCR (RSV, FLU A&B, COVID)  [id number]     EKG  Not  indicated.   RADIOLOGY  Image and radiology report reviewed and interpreted by me. Radiology report consistent with the same.  Chest x-ray negative for acute concerns.  PROCEDURES:  Critical Care performed: No  Procedures   MEDICATIONS ORDERED IN ED:  Medications - No data to display   IMPRESSION / MDM / ASSESSMENT AND PLAN / ED COURSE   I have reviewed the triage note.  Differential diagnosis includes, but is not limited to, COVID, influenza, pneumonia, viral syndrome  Patient's presentation is most consistent with acute complicated illness / injury requiring diagnostic workup.  47 year old male presenting to the emergency department for treatment and evaluation of URI that has been ongoing for about 6 days. He has had chills and sweating, but has not measured his temperature. He was evaluated at [facility] couple days ago and given some prescriptions that did not include antibiotics.  He states that his symptoms have worsened and his cough is now productive of green sputum.   Chest x-ray is without infiltrate.  Viral panel was negative.  Plan will be to treat patient with a round of azithromycin and since he has had little to no relief with medications prescribed at urgent care a few days ago.  He also quit smoking about 2 weeks ago which may be contributing to current symptoms.  He was encouraged to continue the inhalers and other medications as prescribed.  He is to follow-up with his primary care provider or return to the emergency department if symptoms change or worsen.     FINAL CLINICAL IMPRESSION(S) / ED DIAGNOSES   Final diagnoses:  Upper respiratory tract infection, unspecified type     Rx / DC Orders   ED Discharge Orders          Ordered    azithromycin (ZITHROMAX) 250 MG tablet        07/23/22 1054             Note:  This document was prepared using Dragon voice recognition software and may include unintentional dictation errors.   Chinita Pester,  FNP 07/23/22 1457    Jene Every, MD 07/23/22 [phone]

## 2022-07-23 NOTE — Discharge Instructions (Signed)
Take the antibiotic prescribed today and other medications that were prescribed to you at your last visit as directed.  Follow-up with your primary care provider if not improving over the week.  If you are unable to see primary care, return to urgent care or the emergency department for concerns.

## 2022-07-23 NOTE — ED Notes (Signed)
See triage note  Presents with nasal congestion cough and body aches  Afebrile on arrival   was seen this weekend and dx'd with URI  Feels like his sx's are getting worse

## 2022-07-23 NOTE — ED Triage Notes (Signed)
Patient to ED via POV for upper respiratory infection- dx on Sunday at [facility]. States body aches, headache, congestion since last week. Was not prescribed meds and states he is not getting better.

## 2022-10-28 ENCOUNTER — Encounter: Payer: Self-pay | Admitting: Physician Assistant

## 2022-10-28 ENCOUNTER — Ambulatory Visit: Payer: 59 | Admitting: Physician Assistant

## 2022-10-28 VITALS — BP 138/80 | HR 73 | Ht 71.0 in | Wt 146.0 lb

## 2022-10-28 DIAGNOSIS — Z13220 Encounter for screening for lipoid disorders: Secondary | ICD-10-CM

## 2022-10-28 DIAGNOSIS — I10 Essential (primary) hypertension: Secondary | ICD-10-CM | POA: Diagnosis not present

## 2022-10-28 DIAGNOSIS — R251 Tremor, unspecified: Secondary | ICD-10-CM | POA: Insufficient documentation

## 2022-10-28 DIAGNOSIS — Z8619 Personal history of other infectious and parasitic diseases: Secondary | ICD-10-CM | POA: Diagnosis not present

## 2022-10-28 DIAGNOSIS — Z114 Encounter for screening for human immunodeficiency virus [HIV]: Secondary | ICD-10-CM

## 2022-10-28 DIAGNOSIS — G2581 Restless legs syndrome: Secondary | ICD-10-CM | POA: Diagnosis not present

## 2022-10-28 DIAGNOSIS — Z113 Encounter for screening for infections with a predominantly sexual mode of transmission: Secondary | ICD-10-CM

## 2022-10-28 DIAGNOSIS — Z1159 Encounter for screening for other viral diseases: Secondary | ICD-10-CM

## 2022-10-28 DIAGNOSIS — Z125 Encounter for screening for malignant neoplasm of prostate: Secondary | ICD-10-CM

## 2022-10-28 DIAGNOSIS — Z23 Encounter for immunization: Secondary | ICD-10-CM | POA: Diagnosis not present

## 2022-10-28 MED ORDER — VALACYCLOVIR HCL 500 MG PO TABS: 500.0000 mg | ORAL_TABLET | Freq: Two times a day (BID) | ORAL | 1 refills | Status: AC | PRN

## 2022-10-28 MED ORDER — PROPRANOLOL HCL 40 MG PO TABS: 40.0000 mg | ORAL_TABLET | Freq: Two times a day (BID) | ORAL | 1 refills | Status: AC

## 2022-10-28 NOTE — Progress Notes (Signed)
Date:  10/28/2022   Name:  Frederick Mendoza   DOB:  1975/01/12   MRN:  [id number]   Chief Complaint: Establish Care (Would like lung cancer screening, wants bloodwork, has not had PCP in 20 years, mother and grandmother has Parkinsons disease he has been shaking like they did )  HPI Frederick Mendoza is a pleasant 47 year old male lost to primary care for about 20 years who presents new to our clinic today to discuss a few chronic concerns.  He has had a tremor since childhood involving upper and lower extremities.  Family history of parkinsonism in mother and grandmother, both deceased.  Tremor improves with alcohol.  Drinks 3-4 standard drinks daily.  Also has restless legs at night which sometimes wake him from sleep unsure if related to his daytime tremor.  Endorses intermittent chest tightness which improves with albuterol.  Does not necessarily seem to be exertional.  There have been a few ED visits in years past with similar complaints, troponin always normal, no history of MI.   Chronic smoking for more than 33 pack years, currently 1 ppd, says in the past some have suspected COPD without a formal diagnosis.  Has albuterol inhaler he uses only occasionally, does not need refill at this time.  Thinking about lung cancer screening in the near future.  Would like routine lab work today, knows he is due.  Requesting routine STI screening.  Endorses known history of HSV with genital lesions, last outbreak about 7 years ago, Valtrex helps when needed.   Medication list has been reviewed and updated.  Current Meds  Medication Sig   Acetaminophen (TYLENOL PO) Take by mouth as needed.   albuterol (VENTOLIN HFA) 108 (90 Base) MCG/ACT inhaler Inhale 2 puffs into the lungs every 4 (four) hours as needed.   propranolol (INDERAL) 40 MG tablet Take 1 tablet (40 mg total) by mouth 2 (two) times daily.   Spacer/Aero-Holding Chambers (AEROCHAMBER MV) inhaler Use as instructed   valACYclovir (VALTREX) 500 MG  tablet Take 1 tablet (500 mg total) by mouth 2 (two) times daily as needed (for outbreak. Use within 24h of onset. Take for 3 days.).     Review of Systems  Constitutional:  Positive for fatigue. Negative for fever.  Respiratory:  Negative for chest tightness and shortness of breath.   Cardiovascular:  Negative for chest pain and palpitations.  Gastrointestinal:  Negative for abdominal pain.  Neurological:  Positive for tremors.  Psychiatric/Behavioral:  Positive for sleep disturbance. The patient is nervous/anxious.     Patient Active Problem List   Diagnosis Date Noted   Tremor 10/28/2022   Primary hypertension 10/28/2022   Restless legs 10/28/2022   Left lumbar radiculopathy 03/21/2017   Lateral epicondylitis of left elbow 10/13/2013    Allergies  Allergen Reactions   Penicillins Anaphylaxis    Has patient had a PCN reaction causing immediate rash, facial/tongue/throat swelling, SOB or lightheadedness with hypotension: Yes  Has patient had a PCN reaction causing severe rash involving mucus membranes or skin necrosis: No  Has patient had a PCN reaction that required hospitalization: No  Has patient had a PCN reaction occurring within the last 10 years: No  If all of the above answers are "NO", then may proceed with Cephalosporin use.  Has patient had a PCN reaction causing immediate rash, facial/tongue/throat swelling, SOB or lightheadedness with hypotension: Yes Has patient had a PCN reaction causing severe rash involving mucus membranes or skin necrosis: No Has patient had a  PCN reaction that required hospitalization: No Has patient had a PCN reaction occurring within the last 10 years: No If all of the above answers are "NO", then may proceed with Cephalosporin use.   Ketorolac Tromethamine Other (See Comments)    Possible diaphoresis, vasovagal  Possible diaphoresis, vasovagal    Possible diaphoresis, vasovagal Possible diaphoresis, vasovagal   Other Nausea Only     steriods possible diaphoresis, vasovagal event  steriods possible diaphoresis, vasovagal event  steriods possible diaphoresis, vasovagal event    Immunization History  Administered Date(s) Administered   Influenza, Seasonal, Injecte, Preservative Fre 10/28/2022   Influenza,inj,Quad PF,6+ Mos 10/13/2013   Tdap 08/30/2016, 11/20/2020    Past Surgical History:  Procedure Laterality Date   CHEST TUBE INSERTION     HERNIA REPAIR      Social History   Tobacco Use   Smoking status: Every Day    Current packs/day: 1.00    Average packs/day: 1 pack/day for 33.8 years (33.8 ttl pk-yrs)    Types: Cigarettes    Start date: 1991   Smokeless tobacco: Current    Types: Chew  Vaping Use   Vaping status: Every Day   Substances: Nicotine  Substance Use Topics   Alcohol use: Yes    Comment: 3-4 daily   Drug use: No    Family History  Problem Relation Age of Onset   Hypertension Mother    Diabetes Mother    Parkinson's disease Mother    Stroke Father    Hypertension Father    Heart disease Father    Throat cancer Father    Hypertension Maternal Grandmother    Heart disease Maternal Grandmother    Parkinson's disease Maternal Grandmother    Hypertension Maternal Grandfather    Hypertension Paternal Grandmother    Hypertension Paternal Grandfather         10/28/2022    9:19 AM  GAD 7 : Generalized Anxiety Score  Nervous, Anxious, on Edge 0  Control/stop worrying 0  Worry too much - different things 3  Trouble relaxing 3  Restless 0  Easily annoyed or irritable 0  Afraid - awful might happen 0  Total GAD 7 Score 6  Anxiety Difficulty Not difficult at all       10/28/2022    9:19 AM  Depression screen PHQ 2/9  Decreased Interest 0  Down, Depressed, Hopeless 0  PHQ - 2 Score 0  Altered sleeping 3  Tired, decreased energy 3  Change in appetite 0  Feeling bad or failure about yourself  0  Trouble concentrating 0  Moving slowly or fidgety/restless 0  Suicidal  thoughts 0  PHQ-9 Score 6  Difficult doing work/chores Not difficult at all    BP Readings from Last 3 Encounters:  10/28/22 138/80  07/23/22 (!) 148/105  07/21/22 131/85    Wt Readings from Last 3 Encounters:  10/28/22 146 lb (66.2 kg)  07/23/22 149 lb 14.6 oz (68 kg)  07/21/22 149 lb 14.6 oz (68 kg)    BP 138/80 (BP Location: Left Arm, Patient Position: Sitting, Cuff Size: Normal)   Pulse 73   Ht 5\' 11"  (1.803 m)   Wt 146 lb (66.2 kg)   SpO2 99%   BMI 20.36 kg/m   Physical Exam Vitals and nursing note reviewed.  Constitutional:      Appearance: Normal appearance.  Neck:     Vascular: No carotid bruit.  Cardiovascular:     Rate and Rhythm: Normal rate and regular rhythm.  Heart sounds: No murmur heard.    No friction rub. No gallop.  Pulmonary:     Effort: Pulmonary effort is normal.     Breath sounds: Normal breath sounds.  Abdominal:     General: There is no distension.  Musculoskeletal:        General: Normal range of motion.  Skin:    General: Skin is warm and dry.  Neurological:     Mental Status: He is alert and oriented to person, place, and time.     Gait: Gait is intact.     Comments: Tremor bilateral hands and feet  Psychiatric:        Mood and Affect: Mood and affect normal.     Recent Labs     Component Value Date/Time   NA 135 02/27/2021 1757   NA 139 04/13/2012 0924   K 3.8 02/27/2021 1757   K 3.9 04/13/2012 0924   CL 100 02/27/2021 1757   CL 108 (H) 04/13/2012 0924   CO2 24 02/27/2021 1757   CO2 26 04/13/2012 0924   GLUCOSE 103 (H) 02/27/2021 1757   GLUCOSE 90 04/13/2012 0924   BUN 17 02/27/2021 1757   BUN 11 04/13/2012 0924   CREATININE 0.80 02/27/2021 1757   CREATININE 0.69 04/13/2012 0924   CALCIUM 9.5 02/27/2021 1757   CALCIUM 8.5 04/13/2012 0924   PROT 7.0 07/15/2018 0735   PROT 6.8 04/13/2012 0924   ALBUMIN 4.1 07/15/2018 0735   ALBUMIN 4.0 04/13/2012 0924   AST 35 07/15/2018 0735   AST 26 04/13/2012 0924   ALT 23  07/15/2018 0735   ALT 23 04/13/2012 0924   ALKPHOS 60 07/15/2018 0735   ALKPHOS 71 04/13/2012 0924   BILITOT 1.7 (H) 07/15/2018 0735   BILITOT 0.8 04/13/2012 0924   GFRNONAA >60 02/27/2021 1757   GFRNONAA >60 04/13/2012 0924   GFRAA >60 05/25/2019 0809   GFRAA >60 04/13/2012 0924    Lab Results  Component Value Date   WBC 9.7 02/27/2021   HGB 13.5 02/27/2021   HCT 39.6 02/27/2021   MCV 91.5 02/27/2021   PLT 297 02/27/2021   No results found for: "HGBA1C" No results found for: "CHOL", "HDL", "LDLCALC", "LDLDIRECT", "TRIG", "CHOLHDL" No results found for: "TSH"   Assessment and Plan:  1. Primary hypertension Check baseline labs.  Patient used atenolol before with undesirable side effects.  Try propranolol as below which may have multiple benefits for blood pressure, tremor, and anxiety. - CBC with Differential/Platelet - Comprehensive metabolic panel - TSH - propranolol (INDERAL) 40 MG tablet; Take 1 tablet (40 mg total) by mouth 2 (two) times daily.  Dispense: 60 tablet; Refill: 1  2. Tremor Plan as above, likely familial tremor.  Very low suspicion for Parkinson's since this has been present from an early age and he has no signs of gait issues, coordination problems, or cognitive complaints.  3. Restless legs Discussed close association of restless leg syndrome with iron deficiency.  Check iron labs today. - Iron, TIBC and Ferritin Panel  4. Screening for hyperlipidemia Check lipids - Lipid panel  5. Screening PSA (prostate specific antigen) - PSA  6. Screening for HIV (human immunodeficiency virus) - HIV Antibody (routine testing w rflx)  7. Need for hepatitis C screening test - Hepatitis C antibody  8. History of herpes simplex infection Patient requesting refill on Valtrex today for as needed use - valACYclovir (VALTREX) 500 MG tablet; Take 1 tablet (500 mg total) by mouth 2 (two) times daily  as needed (for outbreak. Use within 24h of onset. Take for 3  days.).  Dispense: 30 tablet; Refill: 1  9. Need for influenza vaccination Flu shot given today - Flu vaccine trivalent PF, 6mos and older(Flulaval,Afluria,Fluarix,Fluzone)  10. Screening examination for STI Screen for STIs as below - HIV Antibody (routine testing w rflx) - Hepatitis C antibody - GC/Chlamydia Probe Amp - RPR - HSV 1 and 2 Ab, IgG   Return in about 4 weeks (around 11/25/2022) for nonfasting CPE.    Alvester Morin, PA-C, DMSc, Nutritionist [facility] [phone]

## 2022-10-28 NOTE — Patient Instructions (Signed)
-

## 2022-10-29 LAB — HSV 1 AND 2 AB, IGG
HSV 1 Glycoprotein G Ab, IgG: REACTIVE — AB
HSV 2 IgG, Type Spec: NONREACTIVE

## 2022-10-29 LAB — LIPID PANEL
Chol/HDL Ratio: 1.9 ratio (ref 0.0–5.0)
Cholesterol, Total: 200 mg/dL — ABNORMAL HIGH (ref 100–199)
HDL: 104 mg/dL (ref >39)
LDL Chol Calc (NIH): 86 mg/dL (ref 0–99)
Triglycerides: 50 mg/dL (ref 0–149)
VLDL Cholesterol Cal: 10 mg/dL (ref 5–40)

## 2022-10-29 LAB — CBC WITH DIFFERENTIAL/PLATELET
Basophils Absolute: 0.1 10*3/uL (ref 0.0–0.2)
Basos: 2 %
EOS (ABSOLUTE): 0.1 10*3/uL (ref 0.0–0.4)
Eos: 2 %
Hematocrit: 43.1 % (ref 37.5–51.0)
Hemoglobin: 13.9 g/dL (ref 13.0–17.7)
Immature Grans (Abs): 0 10*3/uL (ref 0.0–0.1)
Immature Granulocytes: 0 %
Lymphocytes Absolute: 1.4 10*3/uL (ref 0.7–3.1)
Lymphs: 24 %
MCH: 30.9 pg (ref 26.6–33.0)
MCHC: 32.3 g/dL (ref 31.5–35.7)
MCV: 96 fL (ref 79–97)
Monocytes Absolute: 0.6 10*3/uL (ref 0.1–0.9)
Monocytes: 11 %
Neutrophils Absolute: 3.5 10*3/uL (ref 1.4–7.0)
Neutrophils: 61 %
Platelets: 345 10*3/uL (ref 150–450)
RBC: 4.5 x10E6/uL (ref 4.14–5.80)
RDW: 12 % (ref 11.6–15.4)
WBC: 5.8 10*3/uL (ref 3.4–10.8)

## 2022-10-29 LAB — COMPREHENSIVE METABOLIC PANEL
ALT: 19 [IU]/L (ref 0–44)
AST: 22 [IU]/L (ref 0–40)
Albumin: 5 g/dL (ref 4.1–5.1)
Alkaline Phosphatase: 68 [IU]/L (ref 44–121)
BUN/Creatinine Ratio: 15 (ref 9–20)
BUN: 13 mg/dL (ref 6–24)
Bilirubin Total: 0.5 mg/dL (ref 0.0–1.2)
CO2: 26 mmol/L (ref 20–29)
Calcium: 9.7 mg/dL (ref 8.7–10.2)
Chloride: 98 mmol/L (ref 96–106)
Creatinine, Ser: 0.85 mg/dL (ref 0.76–1.27)
Globulin, Total: 2.1 g/dL (ref 1.5–4.5)
Glucose: 96 mg/dL (ref 70–99)
Potassium: 4.8 mmol/L (ref 3.5–5.2)
Sodium: 137 mmol/L (ref 134–144)
Total Protein: 7.1 g/dL (ref 6.0–8.5)
eGFR: 108 mL/min/{1.73_m2} (ref >59)

## 2022-10-29 LAB — TSH: TSH: 1.72 u[IU]/mL (ref 0.450–4.500)

## 2022-10-29 LAB — IRON,TIBC AND FERRITIN PANEL
Ferritin: 324 ng/mL (ref 30–400)
Iron Saturation: 37 % (ref 15–55)
Iron: 108 ug/dL (ref 38–169)
Total Iron Binding Capacity: 289 ug/dL (ref 250–450)
UIBC: 181 ug/dL (ref 111–343)

## 2022-10-29 LAB — HIV ANTIBODY (ROUTINE TESTING W REFLEX): HIV Screen 4th Generation wRfx: NONREACTIVE

## 2022-10-29 LAB — RPR: RPR Ser Ql: NONREACTIVE

## 2022-10-29 LAB — PSA: Prostate Specific Ag, Serum: 0.3 ng/mL (ref 0.0–4.0)

## 2022-10-29 LAB — HEPATITIS C ANTIBODY: Hep C Virus Ab: NONREACTIVE

## 2022-10-30 ENCOUNTER — Encounter: Payer: Self-pay | Admitting: Physician Assistant

## 2022-10-30 DIAGNOSIS — B009 Herpesviral infection, unspecified: Secondary | ICD-10-CM | POA: Insufficient documentation

## 2022-10-30 LAB — GC/CHLAMYDIA PROBE AMP
Chlamydia trachomatis, NAA: NEGATIVE
Neisseria Gonorrhoeae by PCR: NEGATIVE

## 2022-10-31 NOTE — Progress Notes (Signed)
Called pt unable to reach. Could not leave VM.  PEC may give results if patient returns call - CRM created.  KP

## 2023-08-28 ENCOUNTER — Ambulatory Visit: Admission: EM | Admit: 2023-08-28 | Discharge: 2023-08-28 | Disposition: A | Discharge status: Home / Self Care

## 2023-08-28 DIAGNOSIS — M5442 Lumbago with sciatica, left side: Secondary | ICD-10-CM

## 2023-08-28 DIAGNOSIS — G8929 Other chronic pain: Secondary | ICD-10-CM | POA: Diagnosis not present

## 2023-08-28 DIAGNOSIS — M549 Dorsalgia, unspecified: Secondary | ICD-10-CM

## 2023-08-28 MED ORDER — DEXAMETHASONE SODIUM PHOSPHATE 10 MG/ML IJ SOLN
10.0000 mg | Freq: Once | INTRAMUSCULAR | Status: AC
  Administered 2023-08-28: 10 mg via INTRAMUSCULAR

## 2023-08-28 MED ORDER — PREDNISONE 10 MG PO TABS: ORAL_TABLET | ORAL | 0 refills | Status: AC

## 2023-08-28 MED ORDER — METHOCARBAMOL 500 MG PO TABS: 500.0000 mg | ORAL_TABLET | Freq: Three times a day (TID) | ORAL | 0 refills | Status: AC | PRN

## 2023-08-28 NOTE — ED Provider Notes (Signed)
 [facility]    CSN: [id number] Arrival date & time: 08/28/23  0802      History   Chief Complaint Chief Complaint  Patient presents with   Back Pain    HPI Frederick Mendoza is a 48 y.o. male presenting for thoracic back pain which is worse in the center and left upper back x 3 days. Symptoms began after he was picking something up from the back of his truck.  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  He also reports a history of sciatica and lower back issues.  He has had some pain in the left lower back which occasionally radiates to the left leg but this was not necessarily new.  He is most concerned about the pain in his thoracic back.  Pain is worse with leaning forward and raising left shoulder.  He denies any associated neck pain and has not any numbness, weakness or tingling.  He has been taking Tylenol  and Excedrin.  He denies any home medications, but I am able to see that he is prescribed Suboxone and has it filled regularly.  HPI  Past Medical History:  Diagnosis Date   Bursitis    left shoulder   Diverticulitis    Pneumothorax    Sciatica     Patient Active Problem List   Diagnosis Date Noted   HSV-1 infection 10/30/2022   Tremor 10/28/2022   Primary hypertension 10/28/2022   Restless legs 10/28/2022   Left lumbar radiculopathy 03/21/2017   Lateral epicondylitis of left elbow 10/13/2013    Past Surgical History:  Procedure Laterality Date   CHEST TUBE INSERTION     HERNIA REPAIR         Home Medications    Prior to Admission medications   Medication Sig Start Date End Date Taking? Authorizing Provider  Buprenorphine HCl-Naloxone HCl 8-2 MG FILM Place under the tongue. 08/18/23  Yes [provider]  methocarbamol  (ROBAXIN ) 500 MG tablet Take 1 tablet (500 mg total) by mouth every 8 (eight) hours as needed for muscle spasms. 08/28/23  Yes Arvis Jolan NOVAK, PA-C  predniSONE  (DELTASONE ) 10 MG tablet Take 6 tabs po on day 1 and decrease  by 1 tablet daily until complete 08/28/23  Yes Arvis Jolan B, PA-C  Acetaminophen  (TYLENOL  PO) Take by mouth as needed.    [provider]  albuterol  (VENTOLIN  HFA) 108 (90 Base) MCG/ACT inhaler Inhale 2 puffs into the lungs every 4 (four) hours as needed. 07/21/22   Bernardino Ditch, NP  propranolol  (INDERAL ) 40 MG tablet Take 1 tablet (40 mg total) by mouth 2 (two) times daily. 10/28/22   Manya Toribio SQUIBB, PA  Spacer/Aero-Holding Chambers (AEROCHAMBER MV) inhaler Use as instructed 07/21/22   Bernardino Ditch, NP  valACYclovir  (VALTREX ) 500 MG tablet Take 1 tablet (500 mg total) by mouth 2 (two) times daily as needed (for outbreak. Use within 24h of onset. Take for 3 days.). 10/28/22   Manya Toribio SQUIBB, PA    Family History Family History  Problem Relation Age of Onset   Hypertension Mother    Diabetes Mother    Parkinson's disease Mother    Stroke Father    Hypertension Father    Heart disease Father    Throat cancer Father    Hypertension Maternal Grandmother    Heart disease Maternal Grandmother    Parkinson's disease Maternal Grandmother    Hypertension Maternal Grandfather    Hypertension Paternal Grandmother    Hypertension Paternal Actor  Social History Social History   Tobacco Use   Smoking status: Every Day    Current packs/day: 1.00    Average packs/day: 1 pack/day for 34.6 years (34.6 ttl pk-yrs)    Types: Cigarettes    Start date: 85   Smokeless tobacco: Current    Types: Chew  Vaping Use   Vaping status: Every Day   Substances: Nicotine  Substance Use Topics   Alcohol use: Yes    Comment: 3-4 daily   Drug use: No     Allergies   Penicillins, Ketorolac  tromethamine , and Other   Review of Systems Review of Systems  Respiratory:  Negative for shortness of breath.   Cardiovascular:  Negative for chest pain.  Gastrointestinal:  Negative for abdominal pain.  Genitourinary:  Negative for flank pain.  Musculoskeletal:  Positive for back  pain. Negative for gait problem and joint swelling.  Skin:  Negative for rash.  Neurological:  Negative for weakness and numbness.     Physical Exam Triage Vital Signs ED Triage Vitals  Encounter Vitals Group     BP 08/28/23 0828 124/85     Girls Systolic BP Percentile --      Girls Diastolic BP Percentile --      Boys Systolic BP Percentile --      Boys Diastolic BP Percentile --      Pulse Rate 08/28/23 0828 65     Resp 08/28/23 0828 16     Temp 08/28/23 0828 98.2 F (36.8 C)     Temp Source 08/28/23 0828 Oral     SpO2 08/28/23 0828 96 %     Weight --      Height --      Head Circumference --      Peak Flow --      Pain Score 08/28/23 0825 7     Pain Loc --      Pain Education --      Exclude from Growth Chart --    No data found.  Updated Vital Signs BP 124/85 (BP Location: Left Arm)   Pulse 65   Temp 98.2 F (36.8 C) (Oral)   Resp 16   SpO2 96%      Physical Exam Vitals and nursing note reviewed.  Constitutional:      General: He is not in acute distress.    Appearance: Normal appearance. He is well-developed. He is not ill-appearing.  HENT:     Head: Normocephalic and atraumatic.  Eyes:     Conjunctiva/sclera: Conjunctivae normal.  Cardiovascular:     Rate and Rhythm: Normal rate and regular rhythm.  Pulmonary:     Effort: Pulmonary effort is normal. No respiratory distress.     Breath sounds: Normal breath sounds.  Musculoskeletal:     Cervical back: Neck supple. No tenderness or bony tenderness. No pain with movement. Normal range of motion.     Thoracic back: Tenderness (left upper and mid parathoracic muscles) and bony tenderness (Mid thoracic) present. Decreased range of motion.     Lumbar back: Tenderness (left paralumbar muscles) present. No bony tenderness. Decreased range of motion. Negative right straight leg raise test and negative left straight leg raise test.       Back:  Skin:    General: Skin is warm and dry.     Capillary Refill:  Capillary refill takes less than 2 seconds.  Neurological:     General: No focal deficit present.     Mental Status: He is alert.  Mental status is at baseline.     Motor: No weakness.     Gait: Gait normal.  Psychiatric:        Mood and Affect: Mood normal.        Behavior: Behavior normal.      UC Treatments / Results  Labs (all labs ordered are listed, but only abnormal results are displayed) Labs Reviewed - No data to display  EKG   Radiology No results found.  Procedures Procedures (including critical care time)  Medications Ordered in UC Medications  dexamethasone  (DECADRON ) injection 10 mg (10 mg Intramuscular Given 08/28/23 0852)    Initial Impression / Assessment and Plan / UC Course  I have reviewed the triage vital signs and the nursing notes.  Pertinent labs & imaging results that were available during my care of the patient were reviewed by me and considered in my medical decision making (see chart for details).   48 year old male presents for 3-day history of upper thoracic back pain after lifting something out of his truck.  Also reports lower back pain but this is chronic.  Intermittent sciatica on the left side.  Denies neck pain.  Pain is worse in the upper back.  Has been taking Tylenol  and Excedrin.  He is also prescribed Suboxone but did not mention it during triage or exam.  Presentation consistent with thoracic strain and chronic low back pain.  He was given 10 mg IM dexamethasone .  Reports allergy to ketorolac  causing sweats and possible anaphylaxis per patient.  Prescribed prednisone  taper and also Robaxin .  Advised warm compresses and topicals and continue home meds.  Supportive care.  Work note was given.  Reviewed ED precautions.   Final Clinical Impressions(s) / UC Diagnoses   Final diagnoses:  Upper back pain  Chronic left-sided low back pain with left-sided sciatica     Discharge Instructions      BACK PAIN: Stressed avoiding painful  activities . RICE (REST, ICE, COMPRESSION, ELEVATION) guidelines reviewed. May alternate ice and heat. Consider use of muscle rubs, Salonpas patches, etc. Use medications as directed including muscle relaxers if prescribed. Take anti-inflammatory medications as prescribed or OTC NSAIDs/Tylenol .  F/u with PCP in 7-10 days for reexamination, and please feel free to call or return to the urgent care at any time for any questions or concerns you may have and we will be happy to help you!   BACK PAIN RED FLAGS: If the back pain acutely worsens or there are any red flag symptoms such as numbness/tingling, leg weakness, saddle anesthesia, or loss of bowel/bladder control, go immediately to the ER. Follow up with us  as scheduled or sooner if the pain does not begin to resolve or if it worsens before the follow up    -Continue Suboxone     ED Prescriptions     Medication Sig Dispense Auth. Provider   predniSONE  (DELTASONE ) 10 MG tablet Take 6 tabs po on day 1 and decrease by 1 tablet daily until complete 21 tablet Arvis Huxley B, PA-C   methocarbamol  (ROBAXIN ) 500 MG tablet Take 1 tablet (500 mg total) by mouth every 8 (eight) hours as needed for muscle spasms. 15 tablet Arvis Huxley NOVAK, PA-C      I have reviewed the PDMP during this encounter.   Arvis Huxley NOVAK, PA-C 08/28/23 [phone]

## 2023-08-28 NOTE — ED Triage Notes (Signed)
 Pt states on Monday he was bending over to pick things up out of the back of his truck and felt a pop in the middle of his back, now having pain and trouble with walking and moving. Pt states he is having a shooting pain if he lifts his arms up, and shooting pain down his legs that comes and goes. Taking tylenol  with no relief of pain.

## 2023-08-28 NOTE — Discharge Instructions (Signed)
 BACK PAIN: Stressed avoiding painful activities . RICE (REST, ICE, COMPRESSION, ELEVATION) guidelines reviewed. May alternate ice and heat. Consider use of muscle rubs, Salonpas patches, etc. Use medications as directed including muscle relaxers if prescribed. Take anti-inflammatory medications as prescribed or OTC NSAIDs/Tylenol .  F/u with PCP in 7-10 days for reexamination, and please feel free to call or return to the urgent care at any time for any questions or concerns you may have and we will be happy to help you!   BACK PAIN RED FLAGS: If the back pain acutely worsens or there are any red flag symptoms such as numbness/tingling, leg weakness, saddle anesthesia, or loss of bowel/bladder control, go immediately to the ER. Follow up with us  as scheduled or sooner if the pain does not begin to resolve or if it worsens before the follow up    -Continue Suboxone
